# Patient Record
Sex: Female | Born: 1935 | Race: White | Hispanic: No | Smoking: Never smoker
Health system: Southern US, Community
[De-identification: ages and names within clinical notes are randomized; demographics above are authoritative.]

## PROBLEM LIST (undated history)

## (undated) DIAGNOSIS — F0390 Unspecified dementia without behavioral disturbance: Secondary | ICD-10-CM

## (undated) HISTORY — PX: NO PAST SURGERIES: SHX2092

---

## 2006-02-26 ENCOUNTER — Encounter: Admission: RE | Admit: 2006-02-26 | Discharge: 2006-02-26 | Payer: Self-pay | Admitting: Internal Medicine

## 2008-09-06 ENCOUNTER — Observation Stay (HOSPITAL_COMMUNITY): Admission: EM | Admit: 2008-09-06 | Discharge: 2008-09-06 | Payer: Self-pay | Admitting: Emergency Medicine

## 2008-11-05 ENCOUNTER — Inpatient Hospital Stay (HOSPITAL_COMMUNITY): Admission: EM | Admit: 2008-11-05 | Discharge: 2008-11-07 | Payer: Self-pay | Admitting: Emergency Medicine

## 2008-11-22 ENCOUNTER — Encounter: Admission: RE | Admit: 2008-11-22 | Discharge: 2008-11-22 | Payer: Self-pay | Admitting: Internal Medicine

## 2009-11-05 IMAGING — US US ABDOMEN COMPLETE
1 series · 14 of 25 positions shown · non-contrast
Comparison: None

CLINICAL DATA: Right upper quadrant pain, fever.

COMPLETE ABDOMINAL ULTRASOUND

[Series 1: us abdomen complete · 0.21mm/px · 14 of 65 slices shown]
[im 1/65]
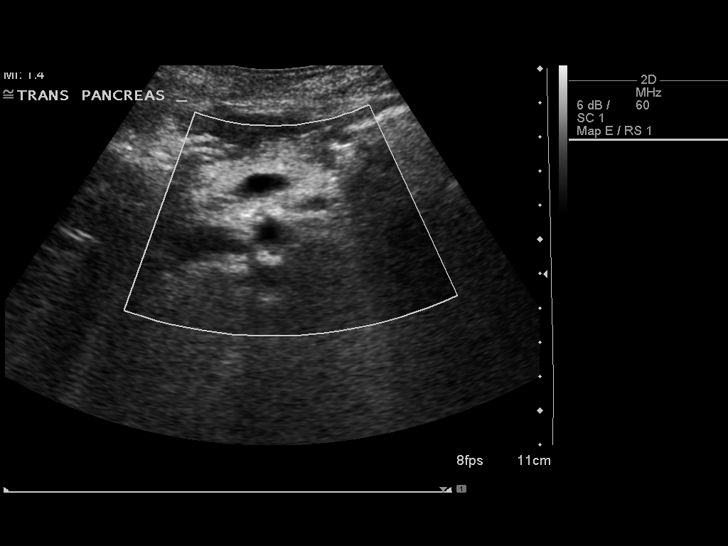
[im 6/65]
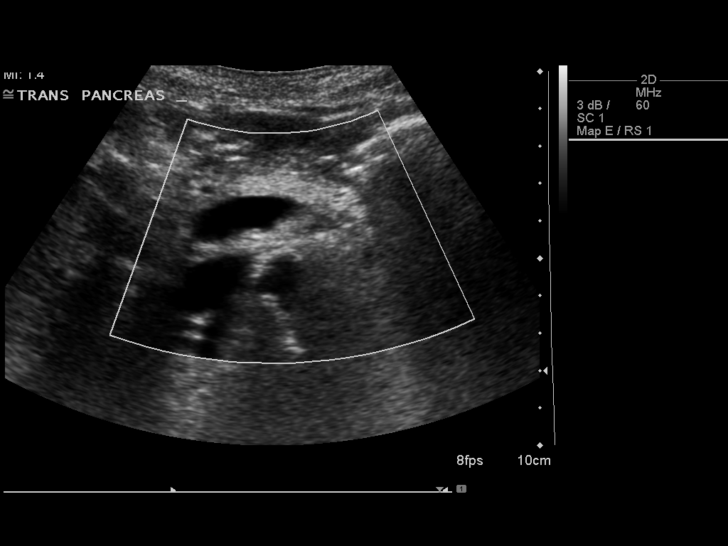
[im 11/65]
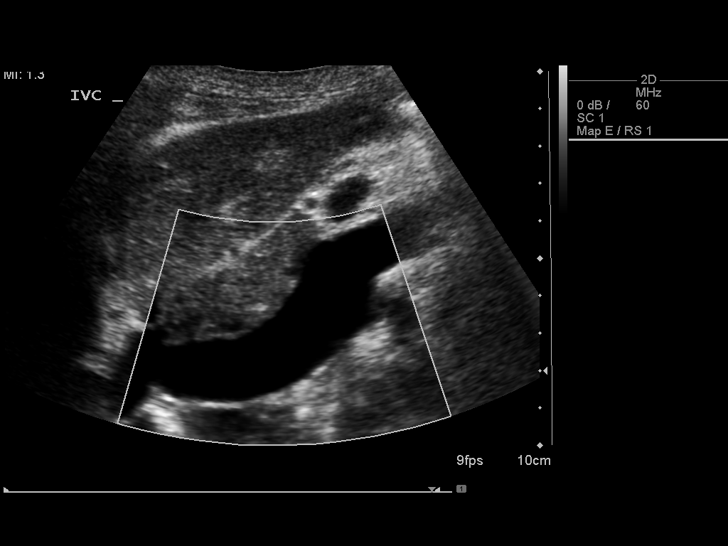
[im 17/65]
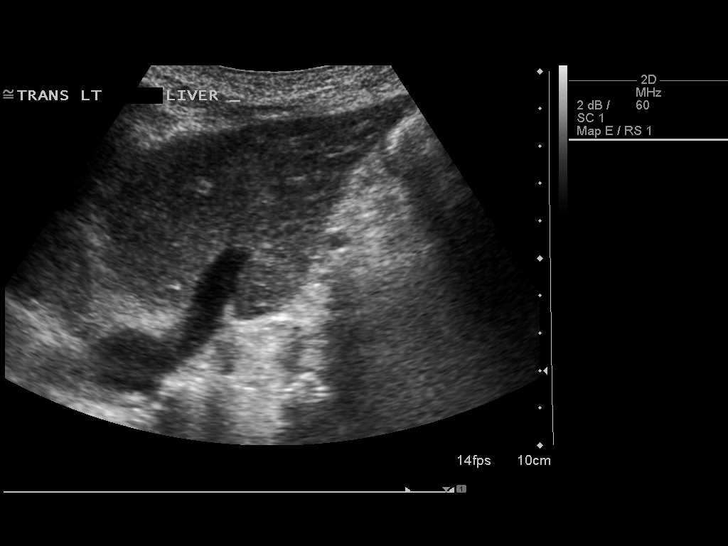
[im 22/65]
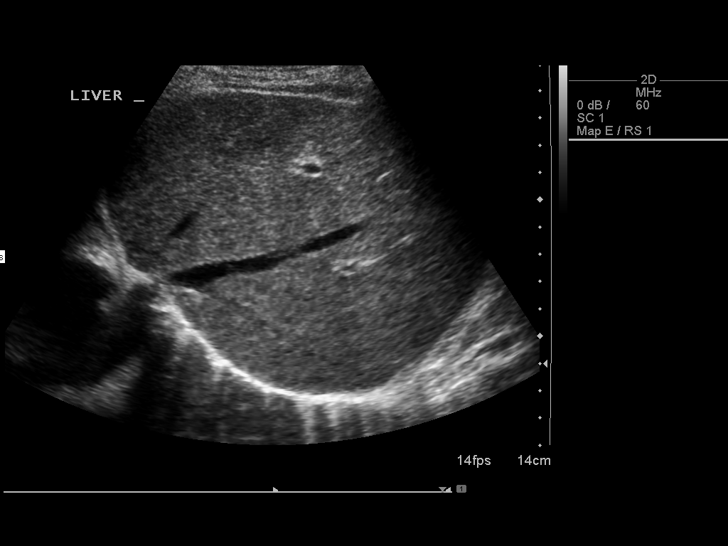
[im 25/65]
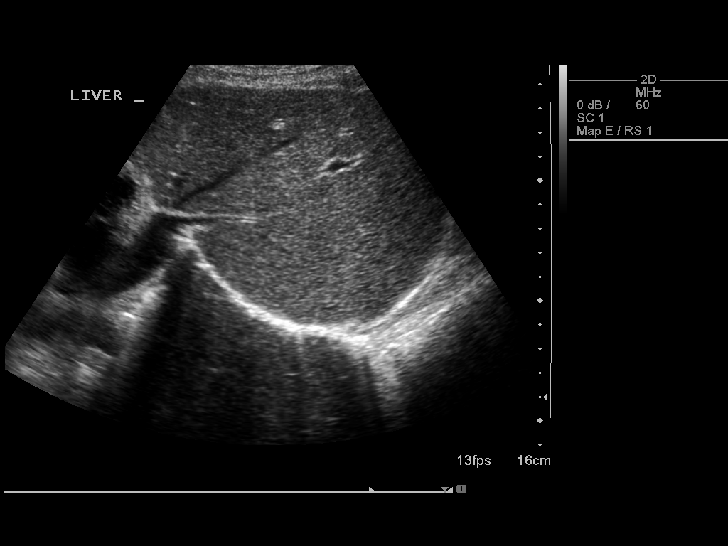
[im 30/65]
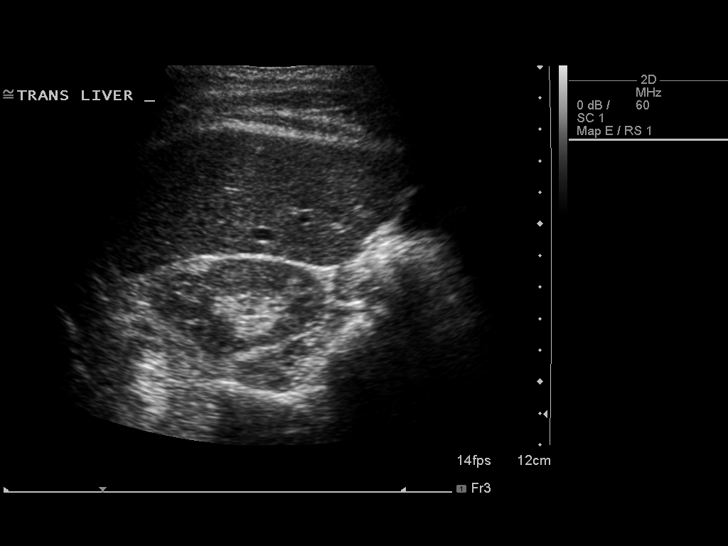
[im 35/65]
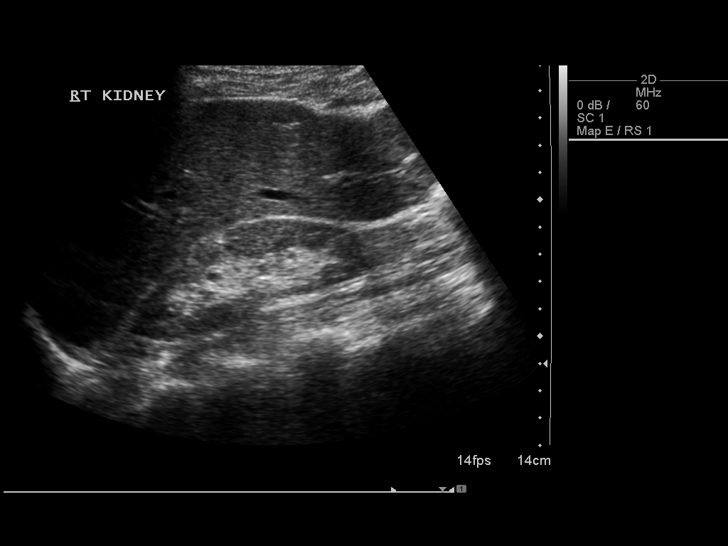
[im 41/65]
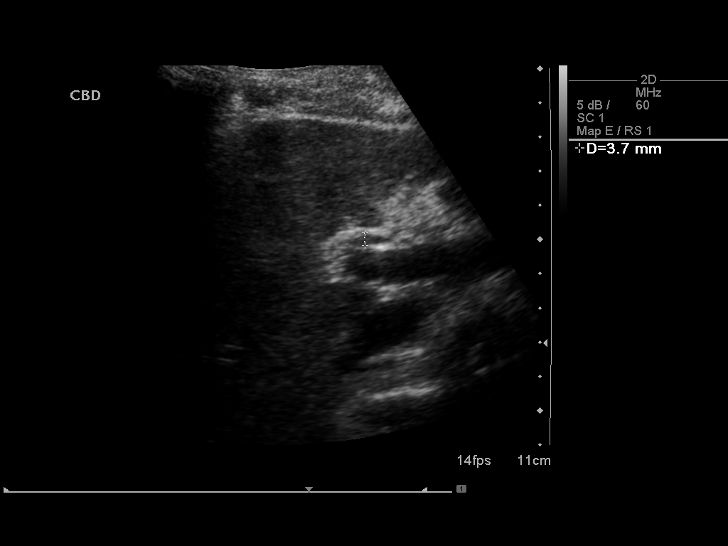
[im 43/65]
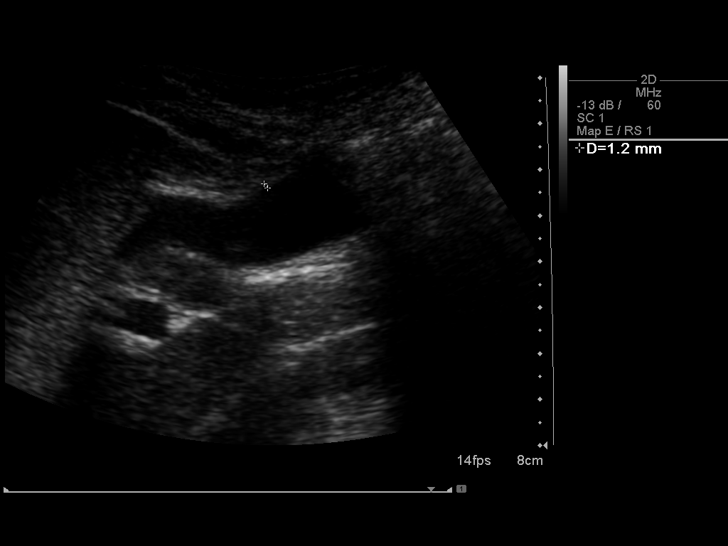
[im 49/65]
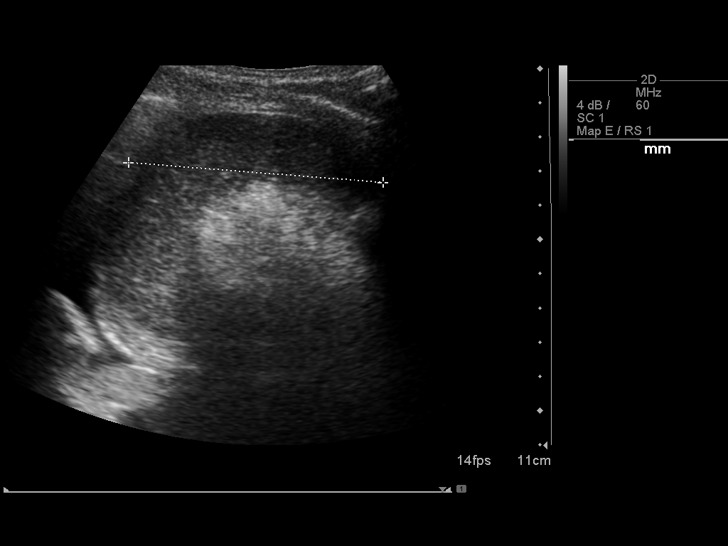
[im 54/65]
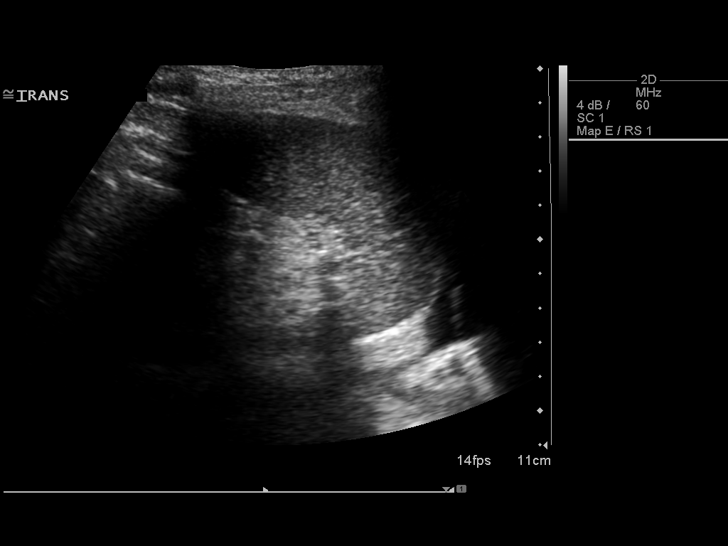
[im 59/65]
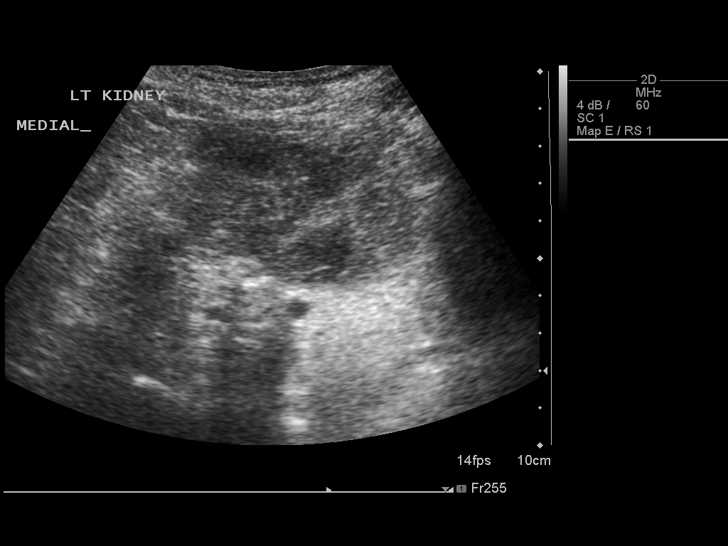
[im 65/65]
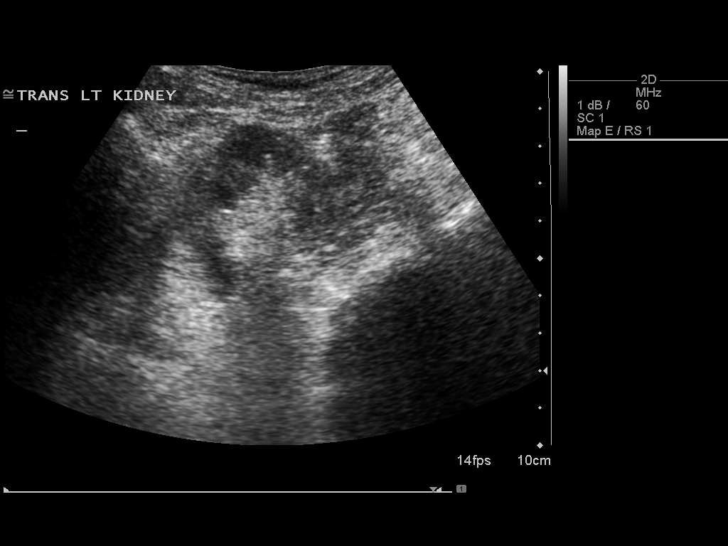

[14 of 25 positions shown; findings below may reference images not displayed]

FINDINGS: Gallbladder:  No stones or wall thickening.

Common bile duct:  Normal caliber, 4 mm.

Liver:  Normal size and echotexture.  No focal abnormality.

IVC:  Patent.

Pancreas:  Although the pancreas is difficult to visualize in its
entirety, no focal pancreatic abnormality is identified.

Spleen:  Normal size and echotexture.  No focal abnormality.

Right Kidney:  Normal size and echotexture.  No focal abnormality.
No hydronephrosis.

Left Kidney:  Normal size and echotexture.  No focal abnormality.
No hydronephrosis.

Abdominal aorta:  Atherosclerotic change.  No evidence of aneurysm.

Incidentally noted is a left pleural effusion.
IMPRESSION: No acute findings in the abdomen.

Small left pleural effusion.

## 2010-05-19 ENCOUNTER — Encounter: Payer: Self-pay | Admitting: Internal Medicine

## 2010-08-04 LAB — CK TOTAL AND CKMB (NOT AT ARMC): Relative Index: INVALID (ref 0.0–2.5)

## 2010-08-04 LAB — DIFFERENTIAL
Basophils Absolute: 0 10*3/uL (ref 0.0–0.1)
Basophils Relative: 0 % (ref 0–1)
Eosinophils Absolute: 0.1 10*3/uL (ref 0.0–0.7)
Eosinophils Relative: 1 % (ref 0–5)
Lymphocytes Relative: 9 % — ABNORMAL LOW (ref 12–46)
Lymphs Abs: 1.1 10*3/uL (ref 0.7–4.0)
Neutro Abs: 9.7 10*3/uL — ABNORMAL HIGH (ref 1.7–7.7)

## 2010-08-04 LAB — BASIC METABOLIC PANEL
CO2: 25 mEq/L (ref 19–32)
CO2: 27 mEq/L (ref 19–32)
Calcium: 8.5 mg/dL (ref 8.4–10.5)
Chloride: 107 mEq/L (ref 96–112)
Chloride: 107 mEq/L (ref 96–112)
GFR calc Af Amer: >60 mL/min (ref >60)
Glucose, Bld: 115 mg/dL — ABNORMAL HIGH (ref 70–99)
Potassium: 3.6 mEq/L (ref 3.5–5.1)
Potassium: 4 mEq/L (ref 3.5–5.1)

## 2010-08-04 LAB — CARDIAC PANEL(CRET KIN+CKTOT+MB+TROPI)
CK, MB: 1.2 ng/mL (ref 0.3–4.0)
Relative Index: INVALID (ref 0.0–2.5)
Total CK: 24 U/L (ref 7–177)
Troponin I: 0.07 ng/mL — ABNORMAL HIGH (ref 0.00–0.06)

## 2010-08-04 LAB — CBC
Hemoglobin: 11.6 g/dL — ABNORMAL LOW (ref 12.0–15.0)
MCHC: 34.6 g/dL (ref 30.0–36.0)
RBC: 3.75 MIL/uL — ABNORMAL LOW (ref 3.87–5.11)
RDW: 13.6 % (ref 11.5–15.5)
WBC: 12.1 10*3/uL — ABNORMAL HIGH (ref 4.0–10.5)

## 2010-08-04 LAB — POCT CARDIAC MARKERS

## 2010-08-04 LAB — SEDIMENTATION RATE: Sed Rate: 11 mm/hr (ref 0–22)

## 2010-08-06 LAB — DIFFERENTIAL
Basophils Absolute: 0 10*3/uL (ref 0.0–0.1)
Eosinophils Relative: 0 % (ref 0–5)
Lymphocytes Relative: 6 % — ABNORMAL LOW (ref 12–46)
Neutro Abs: 7.4 10*3/uL (ref 1.7–7.7)
Neutrophils Relative %: 88 % — ABNORMAL HIGH (ref 43–77)

## 2010-08-06 LAB — CBC
MCHC: 34.2 g/dL (ref 30.0–36.0)
RBC: 3.7 MIL/uL — ABNORMAL LOW (ref 3.87–5.11)
RDW: 14.2 % (ref 11.5–15.5)

## 2010-08-06 LAB — URINALYSIS, ROUTINE W REFLEX MICROSCOPIC
Glucose, UA: NEGATIVE mg/dL
Hgb urine dipstick: NEGATIVE
Nitrite: NEGATIVE
Specific Gravity, Urine: 1.022 (ref 1.005–1.030)
Urobilinogen, UA: 1 mg/dL (ref 0.0–1.0)

## 2010-08-06 LAB — COMPREHENSIVE METABOLIC PANEL
Alkaline Phosphatase: 76 U/L (ref 39–117)
BUN: 24 mg/dL — ABNORMAL HIGH (ref 6–23)
CO2: 26 mEq/L (ref 19–32)
Calcium: 8.4 mg/dL (ref 8.4–10.5)
GFR calc Af Amer: >60 mL/min (ref >60)
Glucose, Bld: 133 mg/dL — ABNORMAL HIGH (ref 70–99)
Potassium: 4.4 mEq/L (ref 3.5–5.1)
Sodium: 135 mEq/L (ref 135–145)
Total Bilirubin: 1.2 mg/dL (ref 0.3–1.2)
Total Protein: 6.2 g/dL (ref 6.0–8.3)

## 2010-08-06 LAB — URINE CULTURE

## 2010-08-06 LAB — URINE MICROSCOPIC-ADD ON

## 2010-09-10 NOTE — H&P (Signed)
Jessica Oneal, Jessica Oneal                 ACCOUNT NO.:  [id number]   MEDICAL RECORD NO.:  [id number]          PATIENT TYPE:  INP   LOCATION:  2022                         FACILITY:  [facility]   PHYSICIAN:  Hollice Espy, M.D.DATE OF BIRTH:  [birth year]   DATE OF ADMISSION:  11/05/2008  DATE OF DISCHARGE:                              HISTORY & PHYSICAL   PRIMARY CARE PHYSICIAN:  Georgann Housekeeper, MD   CHIEF COMPLAINT:  Chest pain.   HISTORY OF PRESENT ILLNESS:  Jessica Oneal is a very pleasant 77 year old  Caucasian female with no significant past medical history.  She is  brought to the emergency room by her daughter after she complained of a  substernal chest pain last night.  She describes the pain as a tightness  and never experienced anything like this before.  There was no  radiation.  Duration is uncertain.  The chest pain was associated with  shortness of breath, lightheaded.  She denies having any cough or  fevers.  There is __________.  There are no specific aggravating or  alleviating factors, but the pain was worsened with deep breath.  There  was no change with position, and the pain is not reproducible with  palpation.  There is no recent long-distance travel or full leg  symptoms.  There are no sick contacts.   REVIEW OF SYSTEMS:  A complete review of systems was done which in  General, Head, Eyes, Ears, Nose And Throat, Cardiovascular, Respiratory,  GI/GU, Endocrine, Musculoskeletal, Neurological, Psychiatric all within  normal limits other than what was mentioned in above.   PAST MEDICAL HISTORY:  Transient ischemic attack.   ALLERGIES:  None.   HOME MEDICATIONS:  Aspirin 81 mg one tablet p.o. daily.   FAMILY HISTORY:  Noncontributory.   SOCIAL HISTORY:  She is widowed, lives in [place].  There is a remote  history of tobacco abuse.  She quit smoking more than 40-50 years ago.  No alcohol or drugs.   PHYSICAL EXAMINATION:  VITAL SIGNS:  T-max 98.4, pulse oximetry  94-100%  on room air, blood pressure 162/61 on presentation, now 145/56, pulse  82.  GENERAL:  Not in any acute distress.  Alert and oriented x3.  Afebrile.  HEENT:  Normocephalic, atraumatic.  Pupils are equal to light and  accommodation.  Extraocular movements intact.  Oral mucous membranes are  moist.  NECK:  Supple.  No JVD, lymphadenopathy or carotid bruits.  LUNGS:  Mild crackles, left base.  CARDIOVASCULAR:  S1, S2 normal.  No murmurs appreciated.  ABDOMEN:  Benign.  EXTREMITIES:  No cyanosis, clubbing or edema.  NEUROLOGICAL:  Grossly nonfocal.   LABORATORY DATA:  CBC with differential:  WBC 12,100, hemoglobin 9.6,  hematocrit 33, platelets 190,000, neutrophils 80%, lymphocytes 9%,  monocytes 10%.  Sodium 140, potassium 4, chloride 107, bicarbonate 25,  BUN 36, glucose 153.  CK MB less than 1, troponin less than 0.05.  EKG:  None available to compare.  Normal sinus rhythm at the rate of 84 beats  per minute.  Axis is normal.  PR and QT intervals are normal.  Nonspecific ST changes.  No evidence of infarction.   ASSESSMENT/PLAN:  1. Chest pain, atypical.  Possible to include chronic ischemia versus      pneumonia versus pericarditis versus gastroesophageal reflux      disease.  The CT was negative for pulmonary embolism.  There is no      evidence of pericardial effusion on the CT scan.  There is mild      leukocytosis with a left shift.  Will continue the Avelox.  Will      also cycle cardiac enzymes to make sure she is not having an acute      myocardial infarction.  Will place her on telemetry.  2. Deep vein thrombosis prophylaxis with subcutaneous heparin.  3. Fluids, electrolytes and nutrition.  Will place her on AHA diet.      Will replace electrolytes as needed.  Will start her on normal      saline 75 cc an hour.   DISPOSITION:  Will admit the patient to cardiac floor with telemetry.      Hollice Espy, M.D.  Electronically Signed     SKK/MEDQ  D:   11/05/2008  T:  11/05/2008  Job:  [id number]

## 2010-09-10 NOTE — Discharge Summary (Signed)
Jessica, Oneal                 ACCOUNT NO.:  [id number]   MEDICAL RECORD NO.:  [id number]          PATIENT TYPE:  INP   LOCATION:  2022                         FACILITY:  [facility]   PHYSICIAN:  Thora Lance, M.D.  DATE OF BIRTH:  Feb 20, 1916   DATE OF ADMISSION:  11/05/2008  DATE OF DISCHARGE:  11/07/2008                               DISCHARGE SUMMARY   REASON FOR ADMISSION:  Jessica Oneal is a 77 year old white female whose  only medical history is a history of TIA and mild dementia.  She was  brought to the emergency room by her daughter after she complained of  substernal chest pain the night prior to admission.  The patient  described the pain as a tightness without radiation.  She felt short of  breath and lightheaded.   SIGNIFICANT FINDINGS:  VITAL SIGNS:  Temperature 98.4, blood pressure  162/61, heart rate 82.  NECK:  Supple.  No JVD.  LUNGS:  Mild crackles, left base.  HEART:  Regular rate and rhythm without murmur, gallop, or rub.  ABDOMEN:  Benign.  EXTREMITIES:  No edema.   CBC, WBC 12.1, hemoglobin 9.6, platelet 190.  Sodium 140, potassium 4,  chloride 107, bicarbonate 25, BUN 36, glucose 153, CK-MB less than 1,  troponin less than 0.05.  EKG, normal sinus rhythm with a rate of 84,  normal axis, normal intervals, and nonspecific ST changes.   HOSPITAL COURSE:  The patient was admitted with an episode of chest  pain.  Her CK and CK-MB remained normal.  Her troponin-I followup was  0.07, which is borderline elevated.  The patient's EKG did not progress.  She was placed on aspirin and low-dose metoprolol.  A CT scan of the  chest was done in the emergency room and showed no evidence of pulmonary  emboli or thoracic aneurysm.  There were mild ground-glass opacities  likely  representing small airway disease.  There was cardiomegaly and  some coronary artery calcification consistent with coronary artery  disease.  The patient's case was discussed with her daughter and  caregiver as well as her son.  The patient has evidence of mild dementia  and lives at home with not 24-hour care, but nighttime care and daytime  assistance.  Given the patient's advanced age and mild dementia, the  family did not feel they desired a full cardiac workup and would not  want invasive therapy.  We decided to treat the patient medically for  probable coronary artery disease/angina.  On the last hospital night,  the patient did have a brief episode of atrial fibrillation with rapid  ventricular response and quickly converted back to normal sinus rhythm.  She will be left on a beta-blocker and aspirin.  The family did indicate  that if the patient had further problems of chest pain and become  unstable, they might reconsider a cardiology consultation.   DISCHARGE DIAGNOSES:  1. Chest pain, probable angina pectoris.  2. Paroxysmal atrial fibrillation.  3. History of transient ischemic attack in May 2010.   PROCEDURES:  CT angiogram of the chest.  DISCHARGE MEDICATIONS:  1. Aspirin 325 mg 1 p.o. daily.  2. Metoprolol XL 25 mg 1 p.o. daily, titrate up as an outpatient as      needed.  3. Isosorbide mononitrate 30 mg 1 p.o. q.a.m.  4. Nitroglycerin 0.4 mg sublingual p.r.n. for chest pain.   DISPOSITION:  Discharged to home.   CODE STATUS:  Full code.   FOLLOWUP:  Two weeks with Dr. Georgann Housekeeper.   DIET:  Heart-healthy diet.           ______________________________  Thora Lance, M.D.     JJG/MEDQ  D:  11/07/2008  T:  11/07/2008  Job:  [id number]

## 2013-04-28 ENCOUNTER — Emergency Department (HOSPITAL_COMMUNITY): Payer: Medicare Other

## 2013-04-28 ENCOUNTER — Encounter (HOSPITAL_COMMUNITY): Payer: Self-pay | Admitting: Emergency Medicine

## 2013-04-28 ENCOUNTER — Inpatient Hospital Stay (HOSPITAL_COMMUNITY): Payer: Medicare Other

## 2013-04-28 ENCOUNTER — Inpatient Hospital Stay (HOSPITAL_COMMUNITY)
Admission: EM | Admit: 2013-04-28 | Discharge: 2013-05-02 | DRG: 871 | Disposition: A | Discharge status: Hospice | Payer: Medicare Other | Attending: Internal Medicine | Admitting: Internal Medicine

## 2013-04-28 DIAGNOSIS — J9600 Acute respiratory failure, unspecified whether with hypoxia or hypercapnia: Secondary | ICD-10-CM | POA: Diagnosis present

## 2013-04-28 DIAGNOSIS — Z66 Do not resuscitate: Secondary | ICD-10-CM | POA: Diagnosis present

## 2013-04-28 DIAGNOSIS — E860 Dehydration: Secondary | ICD-10-CM | POA: Diagnosis present

## 2013-04-28 DIAGNOSIS — A088 Other specified intestinal infections: Secondary | ICD-10-CM | POA: Diagnosis present

## 2013-04-28 DIAGNOSIS — E872 Acidosis, unspecified: Secondary | ICD-10-CM | POA: Diagnosis present

## 2013-04-28 DIAGNOSIS — Z515 Encounter for palliative care: Secondary | ICD-10-CM

## 2013-04-28 DIAGNOSIS — R6520 Severe sepsis without septic shock: Secondary | ICD-10-CM

## 2013-04-28 DIAGNOSIS — N179 Acute kidney failure, unspecified: Secondary | ICD-10-CM | POA: Diagnosis present

## 2013-04-28 DIAGNOSIS — J1100 Influenza due to unidentified influenza virus with unspecified type of pneumonia: Secondary | ICD-10-CM | POA: Diagnosis present

## 2013-04-28 DIAGNOSIS — A419 Sepsis, unspecified organism: Principal | ICD-10-CM | POA: Diagnosis present

## 2013-04-28 DIAGNOSIS — Z79899 Other long term (current) drug therapy: Secondary | ICD-10-CM

## 2013-04-28 DIAGNOSIS — A0811 Acute gastroenteropathy due to Norwalk agent: Secondary | ICD-10-CM | POA: Diagnosis present

## 2013-04-28 DIAGNOSIS — J111 Influenza due to unidentified influenza virus with other respiratory manifestations: Secondary | ICD-10-CM | POA: Diagnosis present

## 2013-04-28 DIAGNOSIS — F0390 Unspecified dementia without behavioral disturbance: Secondary | ICD-10-CM | POA: Diagnosis present

## 2013-04-28 DIAGNOSIS — J101 Influenza due to other identified influenza virus with other respiratory manifestations: Secondary | ICD-10-CM | POA: Diagnosis present

## 2013-04-28 DIAGNOSIS — G9340 Encephalopathy, unspecified: Secondary | ICD-10-CM

## 2013-04-28 HISTORY — DX: Unspecified dementia, unspecified severity, without behavioral disturbance, psychotic disturbance, mood disturbance, and anxiety: F03.90

## 2013-04-28 LAB — CG4 I-STAT (LACTIC ACID): Lactic Acid, Venous: 5.67 mmol/L — ABNORMAL HIGH (ref 0.5–2.2)

## 2013-04-28 LAB — POCT I-STAT 3, ART BLOOD GAS (G3+)
Acid-base deficit: 8 mmol/L — ABNORMAL HIGH (ref 0.0–2.0)
BICARBONATE: 21.6 meq/L (ref 20.0–24.0)
O2 Saturation: 89 %
PH ART: 7.135 — AB (ref 7.350–7.450)
PO2 ART: 74 mmHg — AB (ref 80.0–100.0)
Patient temperature: 98.6
TCO2: 23 mmol/L (ref 0–100)
pCO2 arterial: 64.1 mmHg (ref 35.0–45.0)

## 2013-04-28 LAB — CREATININE, SERUM
Creatinine, Ser: 1.53 mg/dL — ABNORMAL HIGH (ref 0.50–1.10)
GFR calc Af Amer: 32 mL/min — ABNORMAL LOW (ref >90)
GFR calc non Af Amer: 28 mL/min — ABNORMAL LOW (ref >90)

## 2013-04-28 LAB — CBC WITH DIFFERENTIAL/PLATELET
Basophils Absolute: 0 10*3/uL (ref 0.0–0.1)
Basophils Relative: 0 % (ref 0–1)
Eosinophils Absolute: 0 10*3/uL (ref 0.0–0.7)
Eosinophils Relative: 0 % (ref 0–5)
HCT: 35.1 % — ABNORMAL LOW (ref 36.0–46.0)
Hemoglobin: 11.6 g/dL — ABNORMAL LOW (ref 12.0–15.0)
Lymphocytes Relative: 18 % (ref 12–46)
Lymphs Abs: 2.1 10*3/uL (ref 0.7–4.0)
MCH: 30.7 pg (ref 26.0–34.0)
MCHC: 33 g/dL (ref 30.0–36.0)
MCV: 92.9 fL (ref 78.0–100.0)
MONO ABS: 0.7 10*3/uL (ref 0.1–1.0)
Monocytes Relative: 6 % (ref 3–12)
NEUTROS PCT: 76 % (ref 43–77)
Neutro Abs: 9 10*3/uL — ABNORMAL HIGH (ref 1.7–7.7)
Platelets: 199 10*3/uL (ref 150–400)
RBC: 3.78 MIL/uL — ABNORMAL LOW (ref 3.87–5.11)
RDW: 14.3 % (ref 11.5–15.5)
WBC: 11.8 10*3/uL — AB (ref 4.0–10.5)

## 2013-04-28 LAB — POCT I-STAT, CHEM 8
BUN: 42 mg/dL — ABNORMAL HIGH (ref 6–23)
CHLORIDE: 101 meq/L (ref 96–112)
Calcium, Ion: 1.1 mmol/L — ABNORMAL LOW (ref 1.13–1.30)
Creatinine, Ser: 1.7 mg/dL — ABNORMAL HIGH (ref 0.50–1.10)
Glucose, Bld: 185 mg/dL — ABNORMAL HIGH (ref 70–99)
HCT: 35 % — ABNORMAL LOW (ref 36.0–46.0)
Hemoglobin: 11.9 g/dL — ABNORMAL LOW (ref 12.0–15.0)
Potassium: 4.1 mEq/L (ref 3.7–5.3)
SODIUM: 138 meq/L (ref 137–147)
TCO2: 24 mmol/L (ref 0–100)

## 2013-04-28 LAB — URINALYSIS, ROUTINE W REFLEX MICROSCOPIC
BILIRUBIN URINE: NEGATIVE
BILIRUBIN URINE: NEGATIVE
GLUCOSE, UA: NEGATIVE mg/dL
Glucose, UA: NEGATIVE mg/dL
Ketones, ur: NEGATIVE mg/dL
Ketones, ur: NEGATIVE mg/dL
Leukocytes, UA: NEGATIVE
Leukocytes, UA: NEGATIVE
Nitrite: NEGATIVE
Nitrite: NEGATIVE
PH: 5 (ref 5.0–8.0)
PH: 5 (ref 5.0–8.0)
Protein, ur: 30 mg/dL — AB
Protein, ur: 30 mg/dL — AB
SPECIFIC GRAVITY, URINE: 1.021 (ref 1.005–1.030)
SPECIFIC GRAVITY, URINE: 1.024 (ref 1.005–1.030)
UROBILINOGEN UA: 1 mg/dL (ref 0.0–1.0)
Urobilinogen, UA: 1 mg/dL (ref 0.0–1.0)

## 2013-04-28 LAB — CBC
HEMATOCRIT: 31.8 % — AB (ref 36.0–46.0)
HEMOGLOBIN: 10.7 g/dL — AB (ref 12.0–15.0)
MCH: 31 pg (ref 26.0–34.0)
MCHC: 33.6 g/dL (ref 30.0–36.0)
MCV: 92.2 fL (ref 78.0–100.0)
Platelets: 136 10*3/uL — ABNORMAL LOW (ref 150–400)
RBC: 3.45 MIL/uL — ABNORMAL LOW (ref 3.87–5.11)
RDW: 14.4 % (ref 11.5–15.5)
WBC: 7.2 10*3/uL (ref 4.0–10.5)

## 2013-04-28 LAB — INFLUENZA PANEL BY PCR (TYPE A & B)
H1N1FLUPCR: NOT DETECTED
INFLBPCR: NEGATIVE
Influenza A By PCR: POSITIVE — AB

## 2013-04-28 LAB — POCT I-STAT TROPONIN I: Troponin i, poc: 0.08 ng/mL (ref 0.00–0.08)

## 2013-04-28 LAB — URINE MICROSCOPIC-ADD ON

## 2013-04-28 LAB — HEPATIC FUNCTION PANEL
ALBUMIN: 2.9 g/dL — AB (ref 3.5–5.2)
ALK PHOS: 86 U/L (ref 39–117)
ALT: 13 U/L (ref 0–35)
AST: 31 U/L (ref 0–37)
BILIRUBIN TOTAL: 0.3 mg/dL (ref 0.3–1.2)
Bilirubin, Direct: <0.2 mg/dL (ref 0.0–0.3)
Total Protein: 6.4 g/dL (ref 6.0–8.3)

## 2013-04-28 LAB — MAGNESIUM: Magnesium: 2 mg/dL (ref 1.5–2.5)

## 2013-04-28 LAB — PRO B NATRIURETIC PEPTIDE: Pro B Natriuretic peptide (BNP): 9395 pg/mL — ABNORMAL HIGH (ref 0–450)

## 2013-04-28 MED ORDER — PIPERACILLIN-TAZOBACTAM 3.375 G IVPB 30 MIN
3.3750 g | Freq: Once | INTRAVENOUS | Status: AC
  Administered 2013-04-28: 3.375 g via INTRAVENOUS
  Filled 2013-04-28: qty 50

## 2013-04-28 MED ORDER — PIPERACILLIN-TAZOBACTAM 3.375 G IVPB 30 MIN: 3.3750 g | Freq: Once | INTRAVENOUS | Status: DC

## 2013-04-28 MED ORDER — ENOXAPARIN SODIUM 40 MG/0.4ML ~~LOC~~ SOLN
40.0000 mg | SUBCUTANEOUS | Status: DC
  Administered 2013-04-28: 40 mg via SUBCUTANEOUS
  Filled 2013-04-28: qty 0.4

## 2013-04-28 MED ORDER — SODIUM CHLORIDE 0.9 % IJ SOLN
3.0000 mL | Freq: Two times a day (BID) | INTRAMUSCULAR | Status: DC
  Administered 2013-04-28 – 2013-04-30 (×5): 3 mL via INTRAVENOUS

## 2013-04-28 MED ORDER — ALBUTEROL SULFATE (2.5 MG/3ML) 0.083% IN NEBU
INHALATION_SOLUTION | RESPIRATORY_TRACT | Status: AC
  Administered 2013-04-28: 2.5 mg
  Filled 2013-04-28: qty 6

## 2013-04-28 MED ORDER — VANCOMYCIN HCL 1000 MG IV SOLR: 15.0000 mg/kg | Freq: Once | INTRAVENOUS | Status: DC

## 2013-04-28 MED ORDER — SODIUM CHLORIDE 0.9 % IV SOLN: INTRAVENOUS | Status: DC

## 2013-04-28 MED ORDER — VANCOMYCIN HCL 500 MG IV SOLR: 500.0000 mg | INTRAVENOUS | Status: DC

## 2013-04-28 MED ORDER — SODIUM CHLORIDE 0.9 % IV BOLUS (SEPSIS)
500.0000 mL | Freq: Once | INTRAVENOUS | Status: AC
  Administered 2013-04-28: 500 mL via INTRAVENOUS

## 2013-04-28 MED ORDER — PIPERACILLIN-TAZOBACTAM IN DEX 2-0.25 GM/50ML IV SOLN
2.2500 g | Freq: Three times a day (TID) | INTRAVENOUS | Status: DC
  Administered 2013-04-29 (×2): 2.25 g via INTRAVENOUS
  Filled 2013-04-28 (×4): qty 50

## 2013-04-28 MED ORDER — VANCOMYCIN HCL IN DEXTROSE 750-5 MG/150ML-% IV SOLN
750.0000 mg | Freq: Once | INTRAVENOUS | Status: AC
  Administered 2013-04-28: 750 mg via INTRAVENOUS
  Filled 2013-04-28: qty 150

## 2013-04-28 NOTE — ED Notes (Signed)
Pt sick yesterday and started having altered mental status. Today pt responding only to painful stimuli. Pt has audible rales and required CPAP in route to ED. Pt's sats 94% on CPAP, BP 120/50, sinus tach. Pt also developed tremors today. Pt has no significant medical history.

## 2013-04-28 NOTE — ED Provider Notes (Signed)
CSN: [id number]     Arrival date & time 04/28/13  1558 History   First MD Initiated Contact with Patient 04/28/13 1608     Chief Complaint  Patient presents with  . Shortness of Breath   HPI  80 y/o female with no significant past medical history (currently not on any mediations per the family) who presents via EMS for shortness of breath. The patient states the patient has been sick for the past two days. They states she has had cough. At baseline they states she is usually not very interactive and will only sometimes speak. Today she has been more lethargic than normal. EMS was called and she was started on CPAP. Additional history and ROS are limited secondary to the condition of the patient.   Past Medical History  Diagnosis Date  . Dementia    Past Surgical History  Procedure Laterality Date  . No past surgeries     No family history on file. History  Substance Use Topics  . Smoking status: Never Smoker   . Smokeless tobacco: Never Used  . Alcohol Use: No   OB History   Grav Para Term Preterm Abortions TAB SAB Ect Mult Living                 Review of Systems  Unable to perform ROS: Mental status change   Allergies  Review of patient's allergies indicates no known allergies.  Home Medications  No current outpatient prescriptions on file. BP 155/43  Pulse 94  Temp(Src) 98.2 F (36.8 C) (Oral)  Resp 36  Ht 5\' 1"  (1.549 m)  Wt 79 lb 9.4 oz (36.1 kg)  BMI 15.05 kg/m2  SpO2 93% Physical Exam  Nursing note and vitals reviewed. Constitutional: She appears ill.  Thin  HENT:  Head: Normocephalic and atraumatic.  Eyes: Conjunctivae are normal. Pupils are equal, round, and reactive to light.  Neck: Normal range of motion. Neck supple.  Cardiovascular: Normal rate.  Exam reveals no gallop and no friction rub.   No murmur heard. Pulmonary/Chest: Tachypnea noted. She is in respiratory distress (mild). She has decreased breath sounds (bilateral bases). She has rhonchi  (diffuse).  On CPAP  Abdominal: Soft. She exhibits no distension. There is no tenderness.  Musculoskeletal: Normal range of motion. She exhibits no edema and no tenderness.  Neurological: GCS eye subscore is 1. GCS verbal subscore is 1. GCS motor subscore is 1.  Skin: Skin is warm and dry.    ED Course  Procedures (including critical care time) Labs Review Labs Reviewed  PRO B NATRIURETIC PEPTIDE - Abnormal; Notable for the following:    Pro B Natriuretic peptide (BNP) 9395.0 (*)    All other components within normal limits  CBC WITH DIFFERENTIAL - Abnormal; Notable for the following:    WBC 11.8 (*)    RBC 3.78 (*)    Hemoglobin 11.6 (*)    HCT 35.1 (*)    Neutro Abs 9.0 (*)    All other components within normal limits  URINALYSIS, ROUTINE W REFLEX MICROSCOPIC - Abnormal; Notable for the following:    Color, Urine AMBER (*)    APPearance CLOUDY (*)    Hgb urine dipstick TRACE (*)    Protein, ur 30 (*)    All other components within normal limits  INFLUENZA PANEL BY PCR - Abnormal; Notable for the following:    Influenza A By PCR POSITIVE (*)    All other components within normal limits  URINE MICROSCOPIC-ADD ON -  Abnormal; Notable for the following:    Casts HYALINE CASTS (*)    All other components within normal limits  CBC - Abnormal; Notable for the following:    RBC 3.45 (*)    Hemoglobin 10.7 (*)    HCT 31.8 (*)    Platelets 136 (*)    All other components within normal limits  CREATININE, SERUM - Abnormal; Notable for the following:    Creatinine, Ser 1.53 (*)    GFR calc non Af Amer 28 (*)    GFR calc Af Amer 32 (*)    All other components within normal limits  HEPATIC FUNCTION PANEL - Abnormal; Notable for the following:    Albumin 2.9 (*)    All other components within normal limits  URINALYSIS, ROUTINE W REFLEX MICROSCOPIC - Abnormal; Notable for the following:    APPearance TURBID (*)    Hgb urine dipstick MODERATE (*)    Protein, ur 30 (*)    All other  components within normal limits  COMPREHENSIVE METABOLIC PANEL - Abnormal; Notable for the following:    Glucose, Bld 110 (*)    BUN 40 (*)    Creatinine, Ser 1.34 (*)    Calcium 7.8 (*)    Albumin 2.7 (*)    GFR calc non Af Amer 33 (*)    GFR calc Af Amer 38 (*)    All other components within normal limits  CBC - Abnormal; Notable for the following:    WBC 11.0 (*)    RBC 3.44 (*)    Hemoglobin 10.5 (*)    HCT 31.2 (*)    All other components within normal limits  URINE MICROSCOPIC-ADD ON - Abnormal; Notable for the following:    Bacteria, UA FEW (*)    All other components within normal limits  POCT I-STAT 3, BLOOD GAS (G3+) - Abnormal; Notable for the following:    pH, Arterial 7.135 (*)    pCO2 arterial 64.1 (*)    pO2, Arterial 74.0 (*)    Acid-base deficit 8.0 (*)    All other components within normal limits  POCT I-STAT, CHEM 8 - Abnormal; Notable for the following:    BUN 42 (*)    Creatinine, Ser 1.70 (*)    Glucose, Bld 185 (*)    Calcium, Ion 1.10 (*)    Hemoglobin 11.9 (*)    HCT 35.0 (*)    All other components within normal limits  CG4 I-STAT (LACTIC ACID) - Abnormal; Notable for the following:    Lactic Acid, Venous 5.67 (*)    All other components within normal limits  MRSA PCR SCREENING  URINE CULTURE  CULTURE, BLOOD (ROUTINE X 2)  CULTURE, BLOOD (ROUTINE X 2)  CULTURE, BLOOD (ROUTINE X 2)  CULTURE, BLOOD (ROUTINE X 2)  URINE CULTURE  MAGNESIUM  TSH  POCT I-STAT TROPONIN I   Imaging Review Dg Chest 1 View  04/28/2013   CLINICAL DATA:  Shortness of breath.  EXAM: CHEST - 1 VIEW  COMPARISON:  02/19/2009.  FINDINGS: The cardiac silhouette, mediastinal and hilar contours are stable. There is tortuosity and calcification of the thoracic aorta. Chronic emphysematous changes with probable superimposed bronchitis. No focal airspace consolidation or pleural effusion. The bony thorax is intact.  IMPRESSION: Emphysema with low lung volumes.  Suspect overlying  bronchitis.   Electronically Signed   By: Loralie Champagne M.D.   On: 04/28/2013 16:19   Portable Chest 1 View  04/29/2013   CLINICAL DATA:  Pneumonia .  EXAM: PORTABLE CHEST - 1 VIEW  COMPARISON:  04/28/2013.  02/19/2009.  FINDINGS: Mediastinum and hilar structures are normal. Changes suggesting COPD noted. Mild bilateral interstitial prominence noted. These findings suggest pneumonitis. Heart size and pulmonary vascularity normal. No pleural effusion pneumothorax. No acute osseous abnormality.  IMPRESSION: 1. Persistent interstitial prominence suggesting bilateral pneumonitis. 2. COPD.   Electronically Signed   By: Maisie Fus  Register   On: 04/29/2013 07:11   Dg Abd Portable 1v  04/28/2013   CLINICAL DATA:  Diarrhea.  EXAM: PORTABLE ABDOMEN - 1 VIEW  COMPARISON:  Abdominal radiograph 09/06/2008.  FINDINGS: Gas and stool are noted throughout the colon extending to the level of the distal rectum. Multiple nondilated gas-filled loops of small bowel project over the central abdomen. No definite pathologic dilatation of small bowel. No gross evidence of pneumoperitoneum on this single supine view. A rectal thermistor noted.  IMPRESSION: 1. Nonspecific, nonobstructive bowel gas pattern. 2. No pneumoperitoneum.   Electronically Signed   By: Trudie Reed M.D.   On: 04/28/2013 19:09    EKG Interpretation    Date/Time:  Thursday April 28 2013 16:06:11 EST Ventricular Rate:  108 PR Interval:  133 QRS Duration: 76 QT Interval:  394 QTC Calculation: 528 R Axis:   33 Text Interpretation:  Sinus tachycardia Abnormal R-wave progression, early transition Nonspecific T abnormalities, lateral leads Prolonged QT interval Confirmed by BEATON  MD, ROBERT ([id number]) on 04/28/2013 6:06:48 PM            MDM   1. Influenza A   2. Acute respiratory failure   3. Sepsis     Ill appearing. Febrile to 101.3. Tachypnic and hypoxic. Mixed respiratory and metabolic acidosis. CXR clear without focal infiltrate but  suspect pneumonia as etiology. Lactate elevated. ABG with hypercarbic as well as hypoxic respiratory failure. Maintained on BiPAP throughout her ED stay. Mental status poor but family states the patient would not want to be intubated or receive chest compressions. Broad spectrum ABX given after obtaining cultures. ARF noted on BMP. Flu swab pending. The patient was admitted to stepdown.     Shanon Ace, MD 04/29/13 1710

## 2013-04-28 NOTE — Progress Notes (Signed)
eLink Physician-Brief Progress Note Patient Name: Jessica Oneal DOB: Mar 27, 1916 MRN: [id number]  Date of Service  04/28/2013   HPI/Events of Note   Pt admitted by hospitalist  . Came up to unit on bipap but no orders for same  eICU Interventions  bipap ordered    Intervention Category Major Interventions: Respiratory failure - evaluation and management  Shan Levansatrick Wright 04/28/2013, 7:45 PM

## 2013-04-28 NOTE — H&P (Signed)
[facility] History and Physical  Flo Shanksthel Hodgkin JXB:[id number] DOB: [birth year] DOA: 04/28/2013  Referring physician:  PCP: Pcp Not In System   Chief Complaint: AMS   HPI:   80 year old female with history of dementia, previously a patient of Dr. Westley Chandlerkarr hussain , who presents with altered mental status, and is currently unresponsive at the time of this history taking. According to the family that takes care of her, her son her daughter. The patient has been sick for the last 2 days. She has been more sleepy, lethargic, decreased by mouth intake ,  apparently no fever at home, however she was found to be febrile in the ER. The patient apparently had a loose bowel movement in the ED  Patient was fairly hypotensive upon presentation systolic blood pressure in the 70s. ABG showed a pH of 7.135 CO2 of 64. Her lactic acid was elevated at 5.67. Urinalysis was negative Chest x-ray was negative  Family present at the bedside and a very realistic about expectations from this hospitalization. Understand the poor prognosis of the patient, and they do not want any heroic measures.       Review of Systems: negative for the following   Unable to obtain because of underlying dementia except for what is documented in history of present illness      History reviewed. No pertinent past medical history.   History reviewed. No pertinent past surgical history.    Social History:  has no tobacco, alcohol, and drug history on file. Lives at home with family   No Known Allergies  No family history on file.   Prior to Admission medications   Not on File     Physical Exam: Filed Vitals:   04/28/13 1600 04/28/13 1645 04/28/13 1645 04/28/13 1650  BP:  105/40    Pulse:  98    Temp:  101.3 F (38.5 C)    Resp: 33 30    Height:   5\' 1"  (1.549 m)   Weight:   36.288 kg (80 lb)   SpO2: 96% 91%  96%     Constitutional: Vital signs reviewed. Patient is a well-developed and well-nourished in  no acute distress and cooperative with exam. 100, somnolent Head: Normocephalic and atraumatic  Ear: TM normal bilaterally  Mouth: no erythema or exudates, MMM  Eyes: PERRL, EOMI, conjunctivae normal, No scleral icterus.  Neck: Supple, Trachea midline normal ROM, No JVD, mass, thyromegaly, or carotid bruit present.  Cardiovascular: RRR, S1 normal, S2 normal, no MRG, pulses symmetric and intact bilaterally  Pulmonary/Chest: CTAB, no wheezes, rales, or rhonchi  Abdominal: Soft. Non-tender, non-distended, bowel sounds are normal, no masses, organomegaly, or guarding present.  GU: no CVA tenderness Musculoskeletal: No joint deformities, erythema, or stiffness, ROM full and no nontender Ext: no edema and no cyanosis, pulses palpable bilaterally (DP and PT)  Hematology: no cervical, inginal, or axillary adenopathy.  Neurological:  obtunded, Strenght is normal and symmetric bilaterally, cranial nerve II-XII are grossly intact, no focal motor deficit, sensory intact to light touch bilaterally.  Skin: Warm, dry and intact. No rash, cyanosis, or clubbing.  Psychiatric: Normal mood and affect. speech and behavior is normal. Judgment and thought content normal. Cognition and memory are normal.       Labs on Admission:    Basic Metabolic Panel:  Recent Labs Lab 04/28/13 1726  NA 138  K 4.1  CL 101  GLUCOSE 185*  BUN 42*  CREATININE 1.70*   Liver Function Tests: No results found for this  basename: AST, ALT, ALKPHOS, BILITOT, PROT, ALBUMIN,  in the last 168 hours No results found for this basename: LIPASE, AMYLASE,  in the last 168 hours No results found for this basename: AMMONIA,  in the last 168 hours CBC:  Recent Labs Lab 04/28/13 1712 04/28/13 1726  WBC 11.8*  --   NEUTROABS PENDING  --   HGB 11.6* 11.9*  HCT 35.1* 35.0*  MCV 92.9  --   PLT 199  --    Cardiac Enzymes: No results found for this basename: CKTOTAL, CKMB, CKMBINDEX, TROPONINI,  in the last 168 hours  BNP  (last 3 results) No results found for this basename: PROBNP,  in the last 8760 hours    CBG: No results found for this basename: GLUCAP,  in the last 168 hours  Radiological Exams on Admission: Dg Chest 1 View  04/28/2013   CLINICAL DATA:  Shortness of breath.  EXAM: CHEST - 1 VIEW  COMPARISON:  02/19/2009.  FINDINGS: The cardiac silhouette, mediastinal and hilar contours are stable. There is tortuosity and calcification of the thoracic aorta. Chronic emphysematous changes with probable superimposed bronchitis. No focal airspace consolidation or pleural effusion. The bony thorax is intact.  IMPRESSION: Emphysema with low lung volumes.  Suspect overlying bronchitis.   Electronically Signed   By: Loralie Champagne M.D.   On: 04/28/2013 16:19    EKG: Independently reviewed.    Assessment/Plan Active Problems:   Sepsis   Sepsis  Unclear source. Will order repeat chest x-ray in the morning. Patient to have viral gastroenteritis may be norovirus versus community-acquired pneumonia versus aspiration pneumonia, with negative chest x-ray because of dehydration Broad-spectrum antibiotics have been initiated History the patient aggressively with IV fluids Patient has been placed on CPAP because of hypercapnia She has a mixture of respiratory as well as metabolic acidosis  Obtain blood culture, urine culture, influenza in the pending  Will order a C. difficile PCR to rule out C. difficile although the patient has not been on any recent antibiotics   Dementia Family realistic about no heroic measures If no improvement in 24-48 hours consider palliative care   Code Status:   DO NOT RESUSCITATE Family Communication: bedside Disposition Plan: admit   Time spent: 70 mins   [facility] Pager [phone]  If 7PM-7AM, please contact night-coverage [email or web address] Password [email or web address] 04/28/2013, 6:04 PM

## 2013-04-28 NOTE — ED Notes (Signed)
Blood cultures were drawn by phlebotomy before hanging antibiotics.

## 2013-04-28 NOTE — ED Notes (Signed)
Pt's family states this is pt's mental baseline. She does not speak and usually is not responsive.

## 2013-04-28 NOTE — Progress Notes (Signed)
ANTIBIOTIC CONSULT NOTE - INITIAL  Pharmacy Consult for vancomycin and Zosyn Indication: sepsis  No Known Allergies  Patient Measurements: Height: 5\' 1"  (154.9 cm) Weight: 80 lb (36.288 kg) IBW/kg (Calculated) : 47.8  Vital Signs: Temp: 101.3 F (38.5 C) (01/01 1645) BP: 105/40 mmHg (01/01 1645) Pulse Rate: 98 (01/01 1645)  Labs:  Recent Labs  04/28/13 1726  HGB 11.9*  CREATININE 1.70*   Estimated Creatinine Clearance: 11.3 ml/min (by C-G formula based on Cr of 1.7). No results found for this basename: VANCOTROUGH, VANCOPEAK, VANCORANDOM, GENTTROUGH, GENTPEAK, GENTRANDOM, TOBRATROUGH, TOBRAPEAK, TOBRARND, AMIKACINPEAK, AMIKACINTROU, AMIKACIN,  in the last 72 hours   Microbiology: No results found for this or any previous visit (from the past 720 hour(s)).  Medical History: History reviewed. No pertinent past medical history.  Medications:  Scheduled:  . enoxaparin (LOVENOX) injection  40 mg Subcutaneous Q24H  . sodium chloride  3 mL Intravenous Q12H  . vancomycin  750 mg Intravenous Once   Infusions:  . sodium chloride     Assessment: 80 yo F presented to emergency department with concerns for AMS and required CPAP in route. Patient with elevated lactic acid of 5.67.  Pharmacy consulted to dose vancomycin and Zosyn for sepsis.    Patient is febrile (101.3) with elevated WBC of 11.8.  She received one dose each of vancomycin IV 750mg  and Zosyn IV 3.375g in the ER.  SCr is 1.7 with an estimated CrCl~11.  Also of note is low body weight of 36.3 kg.    Goal of Therapy:  Resolution of infection Vancomycin trough level 15-20 mcg/ml  Plan:  - begin 48h after initial LD, maintenance dose of vancomycin IV 500mg  q48h - consider random vancomycin level with AM labs prior to redose, monitor kidney function - begin 8h after first dose, maintenance dose of Zosyn 2.25g q8h - f/u WBC, fever curve, cultures, and clinical progression  Harrold DonathNathan E. Achilles Dunkope, PharmD Clinical  Pharmacist - Resident Pager: [phone] Pharmacy: [phone] 04/28/2013 6:13 PM

## 2013-04-28 NOTE — ED Notes (Signed)
NOTIFIED DR. BEATON IN PERSON OF PATIENTS LAB RESULTS OF CG4 LACTIC ACID = 5.67 mmoI/L, @ 17:38 PM, 04/28/2013.

## 2013-04-29 ENCOUNTER — Encounter (HOSPITAL_COMMUNITY): Payer: Self-pay | Admitting: Emergency Medicine

## 2013-04-29 ENCOUNTER — Inpatient Hospital Stay (HOSPITAL_COMMUNITY): Payer: Medicare Other

## 2013-04-29 DIAGNOSIS — J101 Influenza due to other identified influenza virus with other respiratory manifestations: Secondary | ICD-10-CM | POA: Diagnosis present

## 2013-04-29 DIAGNOSIS — A419 Sepsis, unspecified organism: Secondary | ICD-10-CM

## 2013-04-29 DIAGNOSIS — J9600 Acute respiratory failure, unspecified whether with hypoxia or hypercapnia: Secondary | ICD-10-CM | POA: Diagnosis present

## 2013-04-29 DIAGNOSIS — J111 Influenza due to unidentified influenza virus with other respiratory manifestations: Secondary | ICD-10-CM

## 2013-04-29 LAB — CBC
HCT: 31.2 % — ABNORMAL LOW (ref 36.0–46.0)
Hemoglobin: 10.5 g/dL — ABNORMAL LOW (ref 12.0–15.0)
MCH: 30.5 pg (ref 26.0–34.0)
MCHC: 33.7 g/dL (ref 30.0–36.0)
MCV: 90.7 fL (ref 78.0–100.0)
Platelets: 162 10*3/uL (ref 150–400)
RBC: 3.44 MIL/uL — ABNORMAL LOW (ref 3.87–5.11)
RDW: 14.2 % (ref 11.5–15.5)
WBC: 11 10*3/uL — ABNORMAL HIGH (ref 4.0–10.5)

## 2013-04-29 LAB — COMPREHENSIVE METABOLIC PANEL WITH GFR
ALT: 14 U/L (ref 0–35)
AST: 32 U/L (ref 0–37)
Albumin: 2.7 g/dL — ABNORMAL LOW (ref 3.5–5.2)
Alkaline Phosphatase: 91 U/L (ref 39–117)
BUN: 40 mg/dL — ABNORMAL HIGH (ref 6–23)
CO2: 24 meq/L (ref 19–32)
Calcium: 7.8 mg/dL — ABNORMAL LOW (ref 8.4–10.5)
Chloride: 103 meq/L (ref 96–112)
Creatinine, Ser: 1.34 mg/dL — ABNORMAL HIGH (ref 0.50–1.10)
GFR calc Af Amer: 38 mL/min — ABNORMAL LOW
GFR calc non Af Amer: 33 mL/min — ABNORMAL LOW
Glucose, Bld: 110 mg/dL — ABNORMAL HIGH (ref 70–99)
Potassium: 4 meq/L (ref 3.7–5.3)
Sodium: 141 meq/L (ref 137–147)
Total Bilirubin: 0.4 mg/dL (ref 0.3–1.2)
Total Protein: 6.3 g/dL (ref 6.0–8.3)

## 2013-04-29 LAB — URINE CULTURE
COLONY COUNT: NO GROWTH
Colony Count: NO GROWTH
Culture: NO GROWTH
Culture: NO GROWTH

## 2013-04-29 LAB — TSH: TSH: 1.002 u[IU]/mL (ref 0.350–4.500)

## 2013-04-29 LAB — MRSA PCR SCREENING: MRSA by PCR: NEGATIVE

## 2013-04-29 MED ORDER — MORPHINE SULFATE 2 MG/ML IJ SOLN
1.0000 mg | INTRAMUSCULAR | Status: DC | PRN
  Administered 2013-04-30 – 2013-05-01 (×4): 1 mg via INTRAVENOUS
  Filled 2013-04-29 (×4): qty 1

## 2013-04-29 MED ORDER — ENOXAPARIN SODIUM 30 MG/0.3ML ~~LOC~~ SOLN
20.0000 mg | Freq: Every day | SUBCUTANEOUS | Status: DC
  Administered 2013-04-30: 14:00:00 20 mg via SUBCUTANEOUS
  Filled 2013-04-29 (×4): qty 0.2

## 2013-04-29 MED ORDER — OSELTAMIVIR PHOSPHATE 6 MG/ML PO SUSR
30.0000 mg | Freq: Every day | ORAL | Status: DC
  Administered 2013-04-30 – 2013-05-01 (×2): 30 mg via ORAL
  Filled 2013-04-29 (×3): qty 5

## 2013-04-29 MED ORDER — ACETAMINOPHEN 650 MG RE SUPP
650.0000 mg | RECTAL | Status: DC | PRN
  Administered 2013-04-29: 22:00:00 650 mg via RECTAL
  Filled 2013-04-29: qty 1

## 2013-04-29 MED ORDER — OSELTAMIVIR PHOSPHATE 30 MG PO CAPS
30.0000 mg | ORAL_CAPSULE | Freq: Every morning | ORAL | Status: DC
  Administered 2013-04-29: 30 mg via ORAL
  Filled 2013-04-29: qty 1

## 2013-04-29 NOTE — ED Provider Notes (Addendum)
I saw and evaluated the patient, reviewed the resident's note and I agree with the findings and plan.   .Face to face Exam:  General:  nonresponsive HEENT:  Atraumatic Resp: respiratory distress Abd:  Nondistended Neuro:  unable   CRITICAL CARE Performed by: Carollynn Pennywell L Total critical care time: 45 min Critical care time was exclusive of separately billable procedures and treating other patients. Critical care was necessary to treat or prevent imminent or life-threatening deterioration. Critical care was time spent personally by me on the following activities: development of treatment plan with patient and/or surrogate as well as nursing, discussions with consultants, evaluation of patient's response to treatment, examination of patient, obtaining history from patient or surrogate, ordering and performing treatments and interventions, ordering and review of laboratory studies, ordering and review of radiographic studies, pulse oximetry and re-evaluation of patient's condition.   Nelia Shiobert L Emmit Oriley, MD 04/29/13 2135

## 2013-04-29 NOTE — Care Management Note (Addendum)
    Page 1 of 1   04/28/2013     11:50:19 AM   CARE MANAGEMENT NOTE 05/05/2013  Patient:  Mosley,Makella   Account Number:  [id number]  Date Initiated:  04/29/2013  Documentation initiated by:  Junius CreamerWELL,DEBBIE  Subjective/Objective Assessment:   adm w sepsis     Action/Plan:   cared for by son and da   Anticipated DC Date:  2013-12-13   Anticipated DC Plan:    In-house referral  Hospice / Palliative Care      DC Planning Services  CM consult      Choice offered to / List presented to:             Status of service:  In process, will continue to follow Medicare Important Message given?   (If response is "NO", the following Medicare IM given date fields will be blank) Date Medicare IM given:   Date Additional Medicare IM given:    Discharge Disposition:    Per UR Regulation:  Reviewed for med. necessity/level of care/duration of stay  If discussed at Long Length of Stay Meetings, dates discussed:    Comments:  05/02/2013 11:03 Letha Capeeborah Adriona Kaney RN, BSN [phone] Patient is actively dying per MD,  NCM consulted Rose with Hospice to see if patient would be GIP eligible, NCM has not spoke to family yet.  Rose states she will get with Carley Hammedva and they will get back with me to let me know if patient is appropirate.

## 2013-04-29 NOTE — Progress Notes (Signed)
Pt taken off BIPAP to assess for need for continuous BIPAP. Pt placed on 4lpm [place] and tolerating well at this time. RT will continue to monitor.

## 2013-04-29 NOTE — Progress Notes (Signed)
[facility] PROGRESS NOTE  Jessica Oneal ZOX:[id number] DOB: [birth year] DOA: 04/28/2013 PCP: Pcp Not In System  Assessment/Plan:  Principal Problem:   Influenza A Active Problems:   Sepsis   Acute respiratory failure  Off bipap, but tachypneic. No longer hypotensive. Per RN, had a difficult time swallowing tamiflu.  Prognosis poor. Discussed with family. Transfer to floor. Continue tamiflu, but concentrate on comfort measures.  Code Status:  DNR Family Communication:  Son and daughter Disposition Plan:  ?  HPI/Subjective:  Unable  Objective: Filed Vitals:   04/29/13 0957  BP:   Pulse: 87  Temp:   Resp: 30    Intake/Output Summary (Last 24 hours) at 04/29/13 1140 Last data filed at 04/29/13 1100  Gross per 24 hour  Intake    273 ml  Output    350 ml  Net    -77 ml   Filed Weights   04/28/13 1645 04/28/13 1930  Weight: 36.288 kg (80 lb) 36.1 kg (79 lb 9.4 oz)    Exam:   General:  Tachypneic. Nonverbal. Opens eyes to voice.  Cardiovascular: RRR without MGR  Respiratory: tachypneic, diminished  Abdomen: s, not, nd  Ext: no cce  Basic Metabolic Panel:  Recent Labs Lab 04/28/13 1726 04/28/13 1900 04/29/13 0600  NA 138  --  141  K 4.1  --  4.0  CL 101  --  103  CO2  --   --  24  GLUCOSE 185*  --  110*  BUN 42*  --  40*  CREATININE 1.70* 1.53* 1.34*  CALCIUM  --   --  7.8*  MG  --  2.0  --    Liver Function Tests:  Recent Labs Lab 04/28/13 1900 04/29/13 0600  AST 31 32  ALT 13 14  ALKPHOS 86 91  BILITOT 0.3 0.4  PROT 6.4 6.3  ALBUMIN 2.9* 2.7*   No results found for this basename: LIPASE, AMYLASE,  in the last 168 hours No results found for this basename: AMMONIA,  in the last 168 hours CBC:  Recent Labs Lab 04/28/13 1712 04/28/13 1726 04/28/13 1900 04/29/13 0600  WBC 11.8*  --  7.2 11.0*  NEUTROABS 9.0*  --   --   --   HGB 11.6* 11.9* 10.7* 10.5*  HCT 35.1* 35.0* 31.8* 31.2*  MCV 92.9  --  92.2 90.7  PLT 199  --  136*  162   Cardiac Enzymes: No results found for this basename: CKTOTAL, CKMB, CKMBINDEX, TROPONINI,  in the last 168 hours BNP (last 3 results)  Recent Labs  04/28/13 1712  PROBNP 9395.0*   CBG: No results found for this basename: GLUCAP,  in the last 168 hours  Recent Results (from the past 240 hour(s))  MRSA PCR SCREENING     Status: None   Collection Time    04/28/13  7:39 PM      Result Value Range Status   MRSA by PCR NEGATIVE  NEGATIVE Final   Comment:            The GeneXpert MRSA Assay (FDA     approved for NASAL specimens     only), is one component of a     comprehensive MRSA colonization     surveillance program. It is not     intended to diagnose MRSA     infection nor to guide or     monitor treatment for     MRSA infections.     Studies: Dg Chest 1  View  04/28/2013   CLINICAL DATA:  Shortness of breath.  EXAM: CHEST - 1 VIEW  COMPARISON:  02/19/2009.  FINDINGS: The cardiac silhouette, mediastinal and hilar contours are stable. There is tortuosity and calcification of the thoracic aorta. Chronic emphysematous changes with probable superimposed bronchitis. No focal airspace consolidation or pleural effusion. The bony thorax is intact.  IMPRESSION: Emphysema with low lung volumes.  Suspect overlying bronchitis.   Electronically Signed   By: Loralie Champagne M.D.   On: 04/28/2013 16:19   Portable Chest 1 View  04/29/2013   CLINICAL DATA:  Pneumonia .  EXAM: PORTABLE CHEST - 1 VIEW  COMPARISON:  04/28/2013.  02/19/2009.  FINDINGS: Mediastinum and hilar structures are normal. Changes suggesting COPD noted. Mild bilateral interstitial prominence noted. These findings suggest pneumonitis. Heart size and pulmonary vascularity normal. No pleural effusion pneumothorax. No acute osseous abnormality.  IMPRESSION: 1. Persistent interstitial prominence suggesting bilateral pneumonitis. 2. COPD.   Electronically Signed   By: Maisie Fus  Register   On: 04/29/2013 07:11   Dg Abd Portable  1v  04/28/2013   CLINICAL DATA:  Diarrhea.  EXAM: PORTABLE ABDOMEN - 1 VIEW  COMPARISON:  Abdominal radiograph 09/06/2008.  FINDINGS: Gas and stool are noted throughout the colon extending to the level of the distal rectum. Multiple nondilated gas-filled loops of small bowel project over the central abdomen. No definite pathologic dilatation of small bowel. No gross evidence of pneumoperitoneum on this single supine view. A rectal thermistor noted.  IMPRESSION: 1. Nonspecific, nonobstructive bowel gas pattern. 2. No pneumoperitoneum.   Electronically Signed   By: Trudie Reed M.D.   On: 04/28/2013 19:09    Scheduled Meds: . [START ON 04/30/2013] enoxaparin (LOVENOX) injection  20 mg Subcutaneous Daily  . oseltamivir  75 mg Oral BID  . sodium chloride  3 mL Intravenous Q12H   Continuous Infusions: . sodium chloride      Time spent: 35 minutes  [name] L  [facility] Pager [phone]. If 7PM-7AM, please contact night-coverage at [email or web address], password [facility] 04/29/2013, 11:40 AM  LOS: 1 day

## 2013-04-30 MED ORDER — CHLORHEXIDINE GLUCONATE 0.12 % MT SOLN
15.0000 mL | Freq: Two times a day (BID) | OROMUCOSAL | Status: DC
  Administered 2013-04-30 – 2013-05-01 (×3): 15 mL via OROMUCOSAL
  Filled 2013-04-30 (×5): qty 15

## 2013-04-30 MED ORDER — SODIUM CHLORIDE 0.9 % IV SOLN
INTRAVENOUS | Status: DC
  Administered 2013-04-30: 17:00:00 via INTRAVENOUS

## 2013-04-30 MED ORDER — BIOTENE DRY MOUTH MT LIQD
15.0000 mL | Freq: Two times a day (BID) | OROMUCOSAL | Status: DC
  Administered 2013-04-30 – 2013-05-01 (×3): 15 mL via OROMUCOSAL

## 2013-04-30 MED ORDER — RESOURCE THICKENUP CLEAR PO POWD
ORAL | Status: DC | PRN
  Filled 2013-04-30: qty 125

## 2013-04-30 NOTE — Progress Notes (Signed)
Pt restless and fidgety. Granddaughter asked about "something to help her feel more comfortable." Discussed use of prn morphine. Granddaughter agreeable . Will continue to monitor pt.

## 2013-04-30 NOTE — Progress Notes (Addendum)
[facility] PROGRESS NOTE  Jessica Oneal UJW:[id number] DOB: 26-Jan-1916 DOA: 04/28/2013 PCP: Pcp Not In System  Assessment/Plan:  Principal Problem:   Influenza A Active Problems:   Sepsis   Acute respiratory failure  Somewhat better. Start pureed diet and consult ST. Continue tamiflu, nebs, oxygen. Prognosis remains guarded and family aware.  Code Status:  DNR Family Communication:  Granddaughter. Disposition Plan:  ?  HPI/Subjective:  Unable  Objective: Filed Vitals:   04/30/13 1431  BP: 149/53  Pulse: 77  Temp: 97.3 F (36.3 C)  Resp: 24    Intake/Output Summary (Last 24 hours) at 04/30/13 1622 Last data filed at 04/30/13 1100  Gross per 24 hour  Intake      0 ml  Output      0 ml  Net      0 ml   Filed Weights   04/28/13 1645 04/28/13 1930  Weight: 36.288 kg (80 lb) 36.1 kg (79 lb 9.4 oz)    Exam:   General:  Slightly less tachypneic. More alert today. NRB mask  Cardiovascular: RRR without MGR  Respiratory: mild accessory muscle use  Abdomen: s, not, nd  Ext: no cce  Basic Metabolic Panel:  Recent Labs Lab 04/28/13 1726 04/28/13 1900 04/29/13 0600  NA 138  --  141  K 4.1  --  4.0  CL 101  --  103  CO2  --   --  24  GLUCOSE 185*  --  110*  BUN 42*  --  40*  CREATININE 1.70* 1.53* 1.34*  CALCIUM  --   --  7.8*  MG  --  2.0  --    Liver Function Tests:  Recent Labs Lab 04/28/13 1900 04/29/13 0600  AST 31 32  ALT 13 14  ALKPHOS 86 91  BILITOT 0.3 0.4  PROT 6.4 6.3  ALBUMIN 2.9* 2.7*   No results found for this basename: LIPASE, AMYLASE,  in the last 168 hours No results found for this basename: AMMONIA,  in the last 168 hours CBC:  Recent Labs Lab 04/28/13 1712 04/28/13 1726 04/28/13 1900 04/29/13 0600  WBC 11.8*  --  7.2 11.0*  NEUTROABS 9.0*  --   --   --   HGB 11.6* 11.9* 10.7* 10.5*  HCT 35.1* 35.0* 31.8* 31.2*  MCV 92.9  --  92.2 90.7  PLT 199  --  136* 162   Cardiac Enzymes: No results found for this  basename: CKTOTAL, CKMB, CKMBINDEX, TROPONINI,  in the last 168 hours BNP (last 3 results)  Recent Labs  04/28/13 1712  PROBNP 9395.0*   CBG: No results found for this basename: GLUCAP,  in the last 168 hours  Recent Results (from the past 240 hour(s))  URINE CULTURE     Status: None   Collection Time    04/28/13  4:34 PM      Result Value Range Status   Specimen Description URINE, CATHETERIZED   Final   Special Requests NONE   Final   Culture  Setup Time     Final   Value: 04/28/2013 18:45     Performed at [facility] Count     Final   Value: NO GROWTH     Performed at [facility]   Culture     Final   Value: NO GROWTH     Performed at [facility]   Report Status 04/29/2013 FINAL   Final  CULTURE, BLOOD (ROUTINE X 2)  Status: None   Collection Time    04/28/13  5:13 PM      Result Value Range Status   Specimen Description BLOOD RIGHT ANTECUBITAL   Final   Special Requests     Final   Value: BOTTLES DRAWN AEROBIC AND ANAEROBIC 10CC BLUE, 5CC RED   Culture  Setup Time     Final   Value: 04/29/2013 00:28     Performed at [facility]   Culture     Final   Value:        BLOOD CULTURE RECEIVED NO GROWTH TO DATE CULTURE WILL BE HELD FOR 5 DAYS BEFORE ISSUING A FINAL NEGATIVE REPORT     Performed at [facility]   Report Status PENDING   Incomplete  CULTURE, BLOOD (ROUTINE X 2)     Status: None   Collection Time    04/28/13  5:13 PM      Result Value Range Status   Specimen Description BLOOD RIGHT HAND   Final   Special Requests BOTTLES DRAWN AEROBIC AND ANAEROBIC 10CC   Final   Culture  Setup Time     Final   Value: 04/29/2013 00:29     Performed at [facility]   Culture     Final   Value:        BLOOD CULTURE RECEIVED NO GROWTH TO DATE CULTURE WILL BE HELD FOR 5 DAYS BEFORE ISSUING A FINAL NEGATIVE REPORT     Performed at [facility]   Report Status PENDING   Incomplete  CULTURE, BLOOD (ROUTINE  X 2)     Status: None   Collection Time    04/28/13  7:25 PM      Result Value Range Status   Specimen Description BLOOD HAND RIGHT   Final   Special Requests BOTTLES DRAWN AEROBIC ONLY 5CC   Final   Culture  Setup Time     Final   Value: 04/29/2013 01:32     Performed at [facility]   Culture     Final   Value:        BLOOD CULTURE RECEIVED NO GROWTH TO DATE CULTURE WILL BE HELD FOR 5 DAYS BEFORE ISSUING A FINAL NEGATIVE REPORT     Performed at [facility]   Report Status PENDING   Incomplete  CULTURE, BLOOD (ROUTINE X 2)     Status: None   Collection Time    04/28/13  7:25 PM      Result Value Range Status   Specimen Description BLOOD HAND LEFT   Final   Special Requests BOTTLES DRAWN AEROBIC ONLY 5CC   Final   Culture  Setup Time     Final   Value: 04/29/2013 01:31     Performed at [facility]   Culture     Final   Value:        BLOOD CULTURE RECEIVED NO GROWTH TO DATE CULTURE WILL BE HELD FOR 5 DAYS BEFORE ISSUING A FINAL NEGATIVE REPORT     Performed at [facility]   Report Status PENDING   Incomplete  MRSA PCR SCREENING     Status: None   Collection Time    04/28/13  7:39 PM      Result Value Range Status   MRSA by PCR NEGATIVE  NEGATIVE Final   Comment:            The GeneXpert MRSA Assay (FDA     approved  for NASAL specimens     only), is one component of a     comprehensive MRSA colonization     surveillance program. It is not     intended to diagnose MRSA     infection nor to guide or     monitor treatment for     MRSA infections.  URINE CULTURE     Status: None   Collection Time    04/28/13  9:36 PM      Result Value Range Status   Specimen Description URINE, CATHETERIZED   Final   Special Requests CX ADDED AT 2208 ON [id number]   Final   Culture  Setup Time     Final   Value: 04/29/2013 03:08     [facility]   Colony Count     Final   Value: NO GROWTH     [facility]   Culture      Final   Value: NO GROWTH     [facility]   Report Status 04/29/2013 FINAL   Final     Studies: Portable Chest 1 View  04/29/2013   CLINICAL DATA:  Pneumonia .  EXAM: PORTABLE CHEST - 1 VIEW  COMPARISON:  04/28/2013.  02/19/2009.  FINDINGS: Mediastinum and hilar structures are normal. Changes suggesting COPD noted. Mild bilateral interstitial prominence noted. These findings suggest pneumonitis. Heart size and pulmonary vascularity normal. No pleural effusion pneumothorax. No acute osseous abnormality.  IMPRESSION: 1. Persistent interstitial prominence suggesting bilateral pneumonitis. 2. COPD.   Electronically Signed   By: Maisie Fushomas  Register   On: 04/29/2013 07:11   Dg Abd Portable 1v  04/28/2013   CLINICAL DATA:  Diarrhea.  EXAM: PORTABLE ABDOMEN - 1 VIEW  COMPARISON:  Abdominal radiograph 09/06/2008.  FINDINGS: Gas and stool are noted throughout the colon extending to the level of the distal rectum. Multiple nondilated gas-filled loops of small bowel project over the central abdomen. No definite pathologic dilatation of small bowel. No gross evidence of pneumoperitoneum on this single supine view. A rectal thermistor noted.  IMPRESSION: 1. Nonspecific, nonobstructive bowel gas pattern. 2. No pneumoperitoneum.   Electronically Signed   By: Trudie Reedaniel  Entrikin M.D.   On: 04/28/2013 19:09    Scheduled Meds: . antiseptic oral rinse  15 mL Mouth Rinse q12n4p  . chlorhexidine  15 mL Mouth Rinse BID  . enoxaparin (LOVENOX) injection  20 mg Subcutaneous Daily  . oseltamivir  30 mg Oral Daily  . sodium chloride  3 mL Intravenous Q12H   Continuous Infusions: . sodium chloride      Time spent: 25 minutes  Norvin Ohlin L  [facility] Pager [phone]. If 7PM-7AM, please contact night-coverage at [email or web address], password [email or web address] 04/30/2013, 4:22 PM  LOS: 2 days

## 2013-04-30 NOTE — Progress Notes (Signed)
NURSING PROGRESS NOTE  Jessica Oneal [id number] Transfer Data: 04/30/2013 6:16 AM Attending Provider: Christiane Haorinna L Sullivan, MD PCP:Pcp Not In System Code Status: DNR  Jessica Oneal is a 80 y.o. female patient transferred from 2900 -No acute distress noted.  -No complaints of shortness of breath.  -No complaints of chest pain.   Cardiac Monitoring: Box #N/A Cardiac monitor yields;N/A.  Blood pressure 157/49, pulse 72, temperature 97.3 F (36.3 C), temperature source Axillary, resp. rate 28, height 5\' 1"  (1.549 m), weight 36.1 kg (79 lb 9.4 oz), SpO2 91.00%.   IV Fluids:  IV in place, occlusive dsg intact without redness, IV cath hand right, condition patent and no redness  .   Allergies:  Review of patient's allergies indicates no known allergies.  Past Medical History:   has a past medical history of Dementia.  Past Surgical History:   has past surgical history that includes No past surgeries.  Social History:   reports that she has never smoked. She has never used smokeless tobacco. She reports that she does not drink alcohol or use illicit drugs.  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 Information packet given to patient/family. Admission inpatient armband information verified with patient/family to include name and date of birth and placed on patient arm. Side rails up x 2, fall assessment and education completed with patient/family. Patient/family able to verbalize understanding of risk associated with falls and verbalized understanding to call for assistance before getting out of bed. Call light within reach. Patient/family able to voice and demonstrate understanding of unit orientation instructions.    Will continue to evaluate and treat per MD orders.

## 2013-05-01 MED ORDER — LORAZEPAM 2 MG/ML IJ SOLN: 1.0000 mg | INTRAMUSCULAR | Status: DC | PRN

## 2013-05-01 MED ORDER — ENOXAPARIN SODIUM 30 MG/0.3ML ~~LOC~~ SOLN: 20.0000 mg | Freq: Every day | SUBCUTANEOUS | Status: DC

## 2013-05-01 MED ORDER — MORPHINE SULFATE 2 MG/ML IJ SOLN: 2.0000 mg | INTRAMUSCULAR | Status: DC | PRN

## 2013-05-01 MED ORDER — ATROPINE SULFATE 1 % OP SOLN
2.0000 [drp] | OPHTHALMIC | Status: DC | PRN
Start: 2013-05-01 — End: 2013-05-02
  Filled 2013-05-01: qty 2

## 2013-05-01 MED ORDER — SODIUM CHLORIDE 0.9 % IV SOLN
0.2500 mg/h | INTRAVENOUS | Status: DC
  Administered 2013-05-01: 20:00:00 0.25 mg/h via INTRAVENOUS
  Filled 2013-05-01: qty 10

## 2013-05-01 MED ORDER — SCOPOLAMINE 1 MG/3DAYS TD PT72
1.0000 | MEDICATED_PATCH | TRANSDERMAL | Status: DC
  Administered 2013-05-01: 1.5 mg via TRANSDERMAL
  Filled 2013-05-01: qty 1

## 2013-05-01 NOTE — Progress Notes (Signed)
Met with patient's children and multiple grandchildren.  Full Comfort Change to nasal cannula Morphine infusion Anticipate hospital death.  Full note to follow.  Lane Hacker, DO Palliative Medicine

## 2013-05-01 NOTE — Progress Notes (Signed)
[facility] PROGRESS NOTE  Jessica Oneal WJX:[id number] DOB: [birth year] DOA: 04/28/2013 PCP: Pcp Not In System  Assessment/Plan: 1. Sepsis, present on admission, evidenced by hypotension, code likelihood of a 5.67, acute encephalopathy, metabolic acidosis. Chest x-ray showing bilateral pneumonitis. Urine was negative for nitrates and leukocyte esterase. She did test positive for influenza A. 2. Acute encephalopathy. Likely secondary to sepsis. Patient having a significant decline in the last 24 hours. 3. Acute renal failure, in setting of sepsis. Patient's creatinine coming down from 1.7 on 04/28/2048 to 1.34 on 04/29/2048 with IV fluid resuscitation. 4. Goals of care. Given patient's significant clinical deterioration goals of care discussion was held with patient's daughter and granddaughters at bedside regarding goals of care. Patient's disease process likely advanced, despite current interventions at she has continued to decline. Family members expressed a desire to focus patient's care on comfort, minimizing potential burdensome or invasive procedures. She is a DO NOT RESUSCITATE. Will formally consult palliative care.  Code Status: DO NOT RESUSCITATE Family Communication: Had a family meeting today discussing goals of care Disposition Plan: Transition to comfort measures, palliative care consult.   Consultants:  Palliative care    HPI/Subjective: Patient is a pleasant 80 year old female with a past medical history of cognitive impairment, admitted to the medicine service on 04/28/2013 which she presented with mental status changes, found to be unresponsive initially. She presented with hypotension, metabolic acidosis with a pH of 7.135. Goals of care were discussed with family members, as she was given supportive care, started on Tamiflu for influenza A., Administered IV fluids. Despite this intervention, patient has had a significant decline since yesterday. She is now minimally  responsive, having agonal breaths. Family wishes to focus our care on comfort.   Objective: Filed Vitals:   04/30/13 2120  BP: 142/60  Pulse: 73  Temp: 97.3 F (36.3 C)  Resp: 24    Intake/Output Summary (Last 24 hours) at 05/01/13 1658 Last data filed at 05/01/13 1400  Gross per 24 hour  Intake 426.67 ml  Output      0 ml  Net 426.67 ml   Filed Weights   04/28/13 1645 04/28/13 1930  Weight: 36.288 kg (80 lb) 36.1 kg (79 lb 9.4 oz)    Exam:   General:  Patient demonstrated agonal breaths, unresponsive  Cardiovascular: Tachycardic, regular rate rhythm normal S1-S2  Respiratory: Extensive rales, crackles, coarse respiratory sounds bilaterally. Tachypneic  Abdomen: Soft nontender nondistended  Musculoskeletal: Muscle atrophy noted, no edema  Data Reviewed: Basic Metabolic Panel:  Recent Labs Lab 04/28/13 1726 04/28/13 1900 04/29/13 0600  NA 138  --  141  K 4.1  --  4.0  CL 101  --  103  CO2  --   --  24  GLUCOSE 185*  --  110*  BUN 42*  --  40*  CREATININE 1.70* 1.53* 1.34*  CALCIUM  --   --  7.8*  MG  --  2.0  --    Liver Function Tests:  Recent Labs Lab 04/28/13 1900 04/29/13 0600  AST 31 32  ALT 13 14  ALKPHOS 86 91  BILITOT 0.3 0.4  PROT 6.4 6.3  ALBUMIN 2.9* 2.7*   No results found for this basename: LIPASE, AMYLASE,  in the last 168 hours No results found for this basename: AMMONIA,  in the last 168 hours CBC:  Recent Labs Lab 04/28/13 1712 04/28/13 1726 04/28/13 1900 04/29/13 0600  WBC 11.8*  --  7.2 11.0*  NEUTROABS 9.0*  --   --   --  HGB 11.6* 11.9* 10.7* 10.5*  HCT 35.1* 35.0* 31.8* 31.2*  MCV 92.9  --  92.2 90.7  PLT 199  --  136* 162   Cardiac Enzymes: No results found for this basename: CKTOTAL, CKMB, CKMBINDEX, TROPONINI,  in the last 168 hours BNP (last 3 results)  Recent Labs  04/28/13 1712  PROBNP 9395.0*   CBG: No results found for this basename: GLUCAP,  in the last 168 hours  Recent Results (from the  past 240 hour(s))  URINE CULTURE     Status: None   Collection Time    04/28/13  4:34 PM      Result Value Range Status   Specimen Description URINE, CATHETERIZED   Final   Special Requests NONE   Final   Culture  Setup Time     Final   Value: 04/28/2013 18:45     Performed at [facility] Count     Final   Value: NO GROWTH     Performed at [facility]   Culture     Final   Value: NO GROWTH     Performed at [facility]   Report Status 04/29/2013 FINAL   Final  CULTURE, BLOOD (ROUTINE X 2)     Status: None   Collection Time    04/28/13  5:13 PM      Result Value Range Status   Specimen Description BLOOD RIGHT ANTECUBITAL   Final   Special Requests     Final   Value: BOTTLES DRAWN AEROBIC AND ANAEROBIC 10CC BLUE, 5CC RED   Culture  Setup Time     Final   Value: 04/29/2013 00:28     Performed at [facility]   Culture     Final   Value:        BLOOD CULTURE RECEIVED NO GROWTH TO DATE CULTURE WILL BE HELD FOR 5 DAYS BEFORE ISSUING A FINAL NEGATIVE REPORT     Performed at [facility]   Report Status PENDING   Incomplete  CULTURE, BLOOD (ROUTINE X 2)     Status: None   Collection Time    04/28/13  5:13 PM      Result Value Range Status   Specimen Description BLOOD RIGHT HAND   Final   Special Requests BOTTLES DRAWN AEROBIC AND ANAEROBIC 10CC   Final   Culture  Setup Time     Final   Value: 04/29/2013 00:29     Performed at [facility]   Culture     Final   Value:        BLOOD CULTURE RECEIVED NO GROWTH TO DATE CULTURE WILL BE HELD FOR 5 DAYS BEFORE ISSUING A FINAL NEGATIVE REPORT     Performed at [facility]   Report Status PENDING   Incomplete  CULTURE, BLOOD (ROUTINE X 2)     Status: None   Collection Time    04/28/13  7:25 PM      Result Value Range Status   Specimen Description BLOOD HAND RIGHT   Final   Special Requests BOTTLES DRAWN AEROBIC ONLY 5CC   Final   Culture  Setup Time     Final   Value:  04/29/2013 01:32     Performed at [facility]   Culture     Final   Value:        BLOOD CULTURE RECEIVED NO GROWTH TO DATE CULTURE WILL BE HELD FOR 5 DAYS  BEFORE ISSUING A FINAL NEGATIVE REPORT     Performed at [facility]   Report Status PENDING   Incomplete  CULTURE, BLOOD (ROUTINE X 2)     Status: None   Collection Time    04/28/13  7:25 PM      Result Value Range Status   Specimen Description BLOOD HAND LEFT   Final   Special Requests BOTTLES DRAWN AEROBIC ONLY 5CC   Final   Culture  Setup Time     Final   Value: 04/29/2013 01:31     Performed at [facility]   Culture     Final   Value:        BLOOD CULTURE RECEIVED NO GROWTH TO DATE CULTURE WILL BE HELD FOR 5 DAYS BEFORE ISSUING A FINAL NEGATIVE REPORT     Performed at [facility]   Report Status PENDING   Incomplete  MRSA PCR SCREENING     Status: None   Collection Time    04/28/13  7:39 PM      Result Value Range Status   MRSA by PCR NEGATIVE  NEGATIVE Final   Comment:            The GeneXpert MRSA Assay (FDA     approved for NASAL specimens     only), is one component of a     comprehensive MRSA colonization     surveillance program. It is not     intended to diagnose MRSA     infection nor to guide or     monitor treatment for     MRSA infections.  URINE CULTURE     Status: None   Collection Time    04/28/13  9:36 PM      Result Value Range Status   Specimen Description URINE, CATHETERIZED   Final   Special Requests CX ADDED AT 2208 ON 161096   Final   Culture  Setup Time     Final   Value: 04/29/2013 03:08     Performed at [facility]   Colony Count     Final   Value: NO GROWTH     Performed at [facility]   Culture     Final   Value: NO GROWTH     Performed at [facility]   Report Status 04/29/2013 FINAL   Final     Studies: No results found.  Scheduled Meds: . antiseptic oral rinse  15 mL Mouth Rinse q12n4p  . chlorhexidine  15 mL Mouth  Rinse BID  . sodium chloride  3 mL Intravenous Q12H   Continuous Infusions: . sodium chloride 20 mL/hr at 04/30/13 1640    Principal Problem:   Influenza A Active Problems:   Sepsis   Acute respiratory failure    Time spent: 40 minutes    Jeralyn Bennett  [facility] Pager [phone]. If 7PM-7AM, please contact night-coverage at [email or web address], password [facility] 05/01/2013, 4:58 PM  LOS: 3 days

## 2013-05-01 NOTE — Progress Notes (Signed)
Patient's O2 was 74% on 2.5L. Turned patient's O2 up to 6L via nasal canula, O2 went up to 83% on 6L. Patient's son at bedside states that he doesn't want to put venti mask back on patient. Son states she looks more comfortable without the O2 mask. Patient is in no distress at this time. Will continue O2 via nasal cannula at 6L. Will continue to monitor patient. Nelda MarseilleJenny Thacker, RN

## 2013-05-01 NOTE — Progress Notes (Signed)
Patient's son states that he doesn't want staff to turn his mother because she is resting comfortably. Will continue to monitor patient. Nelda MarseilleJenny Thacker, RN

## 2013-05-02 ENCOUNTER — Inpatient Hospital Stay (HOSPITAL_COMMUNITY)
Admission: AD | Admit: 2013-05-02 | Discharge: 2013-05-29 | DRG: 871 | Disposition: E | Source: Ambulatory Visit | Attending: Internal Medicine | Admitting: Internal Medicine

## 2013-05-02 DIAGNOSIS — F0390 Unspecified dementia without behavioral disturbance: Secondary | ICD-10-CM | POA: Diagnosis present

## 2013-05-02 DIAGNOSIS — J9600 Acute respiratory failure, unspecified whether with hypoxia or hypercapnia: Secondary | ICD-10-CM | POA: Diagnosis present

## 2013-05-02 DIAGNOSIS — R5381 Other malaise: Secondary | ICD-10-CM | POA: Diagnosis present

## 2013-05-02 DIAGNOSIS — R627 Adult failure to thrive: Secondary | ICD-10-CM | POA: Diagnosis present

## 2013-05-02 DIAGNOSIS — Z515 Encounter for palliative care: Secondary | ICD-10-CM

## 2013-05-02 DIAGNOSIS — R6520 Severe sepsis without septic shock: Secondary | ICD-10-CM

## 2013-05-02 DIAGNOSIS — A419 Sepsis, unspecified organism: Principal | ICD-10-CM | POA: Diagnosis present

## 2013-05-02 DIAGNOSIS — G9340 Encephalopathy, unspecified: Secondary | ICD-10-CM

## 2013-05-02 DIAGNOSIS — N179 Acute kidney failure, unspecified: Secondary | ICD-10-CM

## 2013-05-02 DIAGNOSIS — R1310 Dysphagia, unspecified: Secondary | ICD-10-CM | POA: Diagnosis present

## 2013-05-02 DIAGNOSIS — J111 Influenza due to unidentified influenza virus with other respiratory manifestations: Secondary | ICD-10-CM | POA: Diagnosis present

## 2013-05-02 MED ORDER — ACETAMINOPHEN 650 MG RE SUPP: 650.0000 mg | RECTAL | Status: DC | PRN

## 2013-05-02 MED ORDER — ATROPINE SULFATE 1 % OP SOLN
2.0000 [drp] | OPHTHALMIC | Status: DC | PRN
  Filled 2013-05-02: qty 2

## 2013-05-02 MED ORDER — SCOPOLAMINE 1 MG/3DAYS TD PT72: 1.0000 | MEDICATED_PATCH | TRANSDERMAL | Status: AC

## 2013-05-02 MED ORDER — ATROPINE SULFATE 1 % OP SOLN: 2.0000 [drp] | OPHTHALMIC | Status: AC | PRN

## 2013-05-02 MED ORDER — LORAZEPAM 2 MG/ML IJ SOLN: 1.0000 mg | INTRAMUSCULAR | Status: AC | PRN

## 2013-05-02 MED ORDER — MORPHINE SULFATE 2 MG/ML IJ SOLN: 2.0000 mg | INTRAMUSCULAR | Status: AC | PRN

## 2013-05-02 MED ORDER — SODIUM CHLORIDE 0.9 % IV SOLN
250.0000 mL | INTRAVENOUS | Status: DC | PRN
  Administered 2013-05-02: 19:00:00 250 mL via INTRAVENOUS

## 2013-05-02 MED ORDER — BIOTENE DRY MOUTH MT LIQD: 15.0000 mL | Freq: Two times a day (BID) | OROMUCOSAL | Status: DC

## 2013-05-02 MED ORDER — SODIUM CHLORIDE 0.9 % IJ SOLN
3.0000 mL | Freq: Two times a day (BID) | INTRAMUSCULAR | Status: DC
  Administered 2013-05-02: 23:00:00 3 mL via INTRAVENOUS

## 2013-05-02 MED ORDER — SODIUM CHLORIDE 0.9 % IV SOLN
0.2500 mg/h | INTRAVENOUS | Status: DC
  Administered 2013-05-02: 0.25 mg/h via INTRAVENOUS
  Filled 2013-05-02: qty 10

## 2013-05-02 MED ORDER — MORPHINE SULFATE 2 MG/ML IJ SOLN: 2.0000 mg | INTRAMUSCULAR | Status: DC | PRN

## 2013-05-02 MED ORDER — SCOPOLAMINE 1 MG/3DAYS TD PT72: 1.0000 | MEDICATED_PATCH | TRANSDERMAL | Status: DC

## 2013-05-02 MED ORDER — LORAZEPAM 2 MG/ML IJ SOLN: 1.0000 mg | INTRAMUSCULAR | Status: DC | PRN

## 2013-05-02 NOTE — Progress Notes (Signed)
Continuous Morphine 1mg /mL bag expired. Wasted 84.5mL in bag with Ginger Gleason, RN as witness. Tubing from previous bag remained in pt's room and used for new bag hung tonight.

## 2013-05-02 NOTE — Progress Notes (Signed)
Speech Language Pathology Discharge Patient Details Name: Jessica Oneal MRN: [id number] DOB: [birth year] Today's Date: 05/12/2013 Time:  -     Patient discharged from SLP services secondary to Pt. full comfort care following Palliative Care consult.  Discussed with MD to D/C ST.  Please see latest therapy progress note for current level of functioning and progress toward goals.    Progress and discharge plan discussed with patient and/or caregiver: Not discussed with pt/family  GO     Royce MacadamiaLisa Willis Natalie Leclaire M.Ed [employer] Pager [phone]

## 2013-05-02 NOTE — Progress Notes (Signed)
Nutrition Brief Note  Chart reviewed. Pt now transitioning to comfort care.  No further nutrition interventions warranted at this time.  Please re-consult as needed.   Kanton Kamel MS, RD, LDN Pager: [phone] After-hours pager: [phone]    

## 2013-05-02 NOTE — Discharge Summary (Signed)
Physician Discharge Summary  Jessica Oneal AVW:[id number] DOB: August 16, 1915 DOA: 04/28/2013  PCP: Pcp Not In System  Admit date: 04/28/2013 Discharge date: 05-31-2013  Time spent: 30 minutes  Recommendations for Outpatient Follow-up:  1. Patient discharged to Hospice  Discharge Diagnoses:  Principal Problem:   Influenza A Active Problems:   Sepsis   Acute respiratory failure   Discharge Condition: Stable  Diet recommendation: NPO  Filed Weights   04/28/13 1645 04/28/13 1930  Weight: 36.288 kg (80 lb) 36.1 kg (79 lb 9.4 oz)    History of present illness:  80 year old female with history of dementia, previously a patient of Dr. Westley Chandler hussain , who presents with altered mental status, and is currently unresponsive at the time of this history taking. According to the family that takes care of her, her son her daughter. The patient has been sick for the last 2 days. She has been more sleepy, lethargic, decreased by mouth intake , apparently no fever at home, however she was found to be febrile in the ER. The patient apparently had a loose bowel movement in the ED  Patient was fairly hypotensive upon presentation systolic blood pressure in the 70s. ABG showed a pH of 7.135 CO2 of 64. Her lactic acid was elevated at 5.67. Urinalysis was negative  Chest x-ray was negative  Family present at the bedside and a very realistic about expectations from this hospitalization. Understand the poor prognosis of the patient, and they do not want any heroic measures.   Hospital Course:  Patient is a pleasant 80 year old female with a past medical history of cognitive impairment, who was admitted to the medicine service on 04/28/2013. She presented to the emergency department with complaints of having an overall functional decline, failure to thrive, he coming increasingly sleepy, less interactive, lethargic, having minimal by mouth intake. She was found to be septic on presentation evidence by hypotension with  systolic blood pressures in the 70s, elevated lactate at 5.67. She tested positive for the flu and was started on Tamiflu. In the following days she did not show improvement and actually continued to decline. Goals of care were discussed with her daughter and granddaughters at bedside. Given that underlying disease process likely advance despite current interventions at continuing to decline, family members expressed desire to focus care on comfort and minimizing burdensome interventions, with a focus on quality of life. Palliative care was consulted. On 05/31/2013, patient having a significant decline, becoming unresponsive having agonal breaths. She will be admitted toGIP, with Dr. Phillips Odor of palliative care taking over her care. I anticipate she will pass in the next 24-48 hours.  Consultations:  Paliative  Discharge Exam: Filed Vitals:   05/31/2013 0505  BP:   Pulse: 80  Temp:   Resp: 24   General: Patient demonstrated agonal breaths, unresponsive  Cardiovascular: Tachycardic, regular rate rhythm normal S1-S2  Respiratory: Extensive rales, crackles, coarse respiratory sounds bilaterally. Tachypneic  Abdomen: Soft nontender nondistended  Musculoskeletal: Muscle atrophy noted, no edema  Discharge Instructions     Medication List    ASK your doctor about these medications       cycloSPORINE 0.05 % ophthalmic emulsion  Commonly known as:  RESTASIS  Place 1 drop into both eyes 2 (two) times daily.       No Known Allergies    The results of significant diagnostics from this hospitalization (including imaging, microbiology, ancillary and laboratory) are listed below for reference.    Significant Diagnostic Studies: Dg Chest 1 View  04/28/2013  CLINICAL DATA:  Shortness of breath.  EXAM: CHEST - 1 VIEW  COMPARISON:  02/19/2009.  FINDINGS: The cardiac silhouette, mediastinal and hilar contours are stable. There is tortuosity and calcification of the thoracic aorta. Chronic  emphysematous changes with probable superimposed bronchitis. No focal airspace consolidation or pleural effusion. The bony thorax is intact.  IMPRESSION: Emphysema with low lung volumes.  Suspect overlying bronchitis.   Electronically Signed   By: Loralie Champagne M.D.   On: 04/28/2013 16:19   Portable Chest 1 View  04/29/2013   CLINICAL DATA:  Pneumonia .  EXAM: PORTABLE CHEST - 1 VIEW  COMPARISON:  04/28/2013.  02/19/2009.  FINDINGS: Mediastinum and hilar structures are normal. Changes suggesting COPD noted. Mild bilateral interstitial prominence noted. These findings suggest pneumonitis. Heart size and pulmonary vascularity normal. No pleural effusion pneumothorax. No acute osseous abnormality.  IMPRESSION: 1. Persistent interstitial prominence suggesting bilateral pneumonitis. 2. COPD.   Electronically Signed   By: Maisie Fus  Register   On: 04/29/2013 07:11   Dg Abd Portable 1v  04/28/2013   CLINICAL DATA:  Diarrhea.  EXAM: PORTABLE ABDOMEN - 1 VIEW  COMPARISON:  Abdominal radiograph 09/06/2008.  FINDINGS: Gas and stool are noted throughout the colon extending to the level of the distal rectum. Multiple nondilated gas-filled loops of small bowel project over the central abdomen. No definite pathologic dilatation of small bowel. No gross evidence of pneumoperitoneum on this single supine view. A rectal thermistor noted.  IMPRESSION: 1. Nonspecific, nonobstructive bowel gas pattern. 2. No pneumoperitoneum.   Electronically Signed   By: Trudie Reed M.D.   On: 04/28/2013 19:09    Microbiology: Recent Results (from the past 240 hour(s))  URINE CULTURE     Status: None   Collection Time    04/28/13  4:34 PM      Result Value Range Status   Specimen Description URINE, CATHETERIZED   Final   Special Requests NONE   Final   Culture  Setup Time     Final   Value: 04/28/2013 18:45     Performed at [facility] Count     Final   Value: NO GROWTH     Performed at [facility]    Culture     Final   Value: NO GROWTH     Performed at [facility]   Report Status 04/29/2013 FINAL   Final  CULTURE, BLOOD (ROUTINE X 2)     Status: None   Collection Time    04/28/13  5:13 PM      Result Value Range Status   Specimen Description BLOOD RIGHT ANTECUBITAL   Final   Special Requests     Final   Value: BOTTLES DRAWN AEROBIC AND ANAEROBIC 10CC BLUE, 5CC RED   Culture  Setup Time     Final   Value: 04/29/2013 00:28     Performed at [facility]   Culture     Final   Value:        BLOOD CULTURE RECEIVED NO GROWTH TO DATE CULTURE WILL BE HELD FOR 5 DAYS BEFORE ISSUING A FINAL NEGATIVE REPORT     Performed at [facility]   Report Status PENDING   Incomplete  CULTURE, BLOOD (ROUTINE X 2)     Status: None   Collection Time    04/28/13  5:13 PM      Result Value Range Status   Specimen Description BLOOD RIGHT HAND   Final  Special Requests BOTTLES DRAWN AEROBIC AND ANAEROBIC 10CC   Final   Culture  Setup Time     Final   Value: 04/29/2013 00:29     Performed at [facility]   Culture     Final   Value:        BLOOD CULTURE RECEIVED NO GROWTH TO DATE CULTURE WILL BE HELD FOR 5 DAYS BEFORE ISSUING A FINAL NEGATIVE REPORT     Performed at [facility]   Report Status PENDING   Incomplete  CULTURE, BLOOD (ROUTINE X 2)     Status: None   Collection Time    04/28/13  7:25 PM      Result Value Range Status   Specimen Description BLOOD HAND RIGHT   Final   Special Requests BOTTLES DRAWN AEROBIC ONLY 5CC   Final   Culture  Setup Time     Final   Value: 04/29/2013 01:32     Performed at [facility]   Culture     Final   Value:        BLOOD CULTURE RECEIVED NO GROWTH TO DATE CULTURE WILL BE HELD FOR 5 DAYS BEFORE ISSUING A FINAL NEGATIVE REPORT     Performed at [facility]   Report Status PENDING   Incomplete  CULTURE, BLOOD (ROUTINE X 2)     Status: None   Collection Time    04/28/13  7:25 PM      Result Value  Range Status   Specimen Description BLOOD HAND LEFT   Final   Special Requests BOTTLES DRAWN AEROBIC ONLY 5CC   Final   Culture  Setup Time     Final   Value: 04/29/2013 01:31     Performed at [facility]   Culture     Final   Value:        BLOOD CULTURE RECEIVED NO GROWTH TO DATE CULTURE WILL BE HELD FOR 5 DAYS BEFORE ISSUING A FINAL NEGATIVE REPORT     Performed at [facility]   Report Status PENDING   Incomplete  MRSA PCR SCREENING     Status: None   Collection Time    04/28/13  7:39 PM      Result Value Range Status   MRSA by PCR NEGATIVE  NEGATIVE Final   Comment:            The GeneXpert MRSA Assay (FDA     approved for NASAL specimens     only), is one component of a     comprehensive MRSA colonization     surveillance program. It is not     intended to diagnose MRSA     infection nor to guide or     monitor treatment for     MRSA infections.  URINE CULTURE     Status: None   Collection Time    04/28/13  9:36 PM      Result Value Range Status   Specimen Description URINE, CATHETERIZED   Final   Special Requests CX ADDED AT 2208 ON 161096   Final   Culture  Setup Time     Final   Value: 04/29/2013 03:08     Performed at [facility]   Colony Count     Final   Value: NO GROWTH     Performed at [facility]   Culture     Final   Value: NO GROWTH     Performed at  [facility]   Report Status 04/29/2013 FINAL   Final     Labs: Basic Metabolic Panel:  Recent Labs Lab 04/28/13 1726 04/28/13 1900 04/29/13 0600  NA 138  --  141  K 4.1  --  4.0  CL 101  --  103  CO2  --   --  24  GLUCOSE 185*  --  110*  BUN 42*  --  40*  CREATININE 1.70* 1.53* 1.34*  CALCIUM  --   --  7.8*  MG  --  2.0  --    Liver Function Tests:  Recent Labs Lab 04/28/13 1900 04/29/13 0600  AST 31 32  ALT 13 14  ALKPHOS 86 91  BILITOT 0.3 0.4  PROT 6.4 6.3  ALBUMIN 2.9* 2.7*   No results found for this basename: LIPASE, AMYLASE,  in the  last 168 hours No results found for this basename: AMMONIA,  in the last 168 hours CBC:  Recent Labs Lab 04/28/13 1712 04/28/13 1726 04/28/13 1900 04/29/13 0600  WBC 11.8*  --  7.2 11.0*  NEUTROABS 9.0*  --   --   --   HGB 11.6* 11.9* 10.7* 10.5*  HCT 35.1* 35.0* 31.8* 31.2*  MCV 92.9  --  92.2 90.7  PLT 199  --  136* 162   Cardiac Enzymes: No results found for this basename: CKTOTAL, CKMB, CKMBINDEX, TROPONINI,  in the last 168 hours BNP: BNP (last 3 results)  Recent Labs  04/28/13 1712  PROBNP 9395.0*   CBG: No results found for this basename: GLUCAP,  in the last 168 hours     Signed:  Jeralyn BennettZAMORA, Ly Wass  [facility] 05/16/2013, 5:22 PM

## 2013-05-02 NOTE — Progress Notes (Signed)
Room 5W21 - Jessica Oneal - Pt admitted under GIP status [facility] Cape[facility]ah [facility].  [facility]-R.Lulla Linville RN  GIP [facility] diagnosis of 331 - Alzheimer's dementia. Pt is DNR code. Pt non responsive with labored breathing - discussed with staff RN. Pt's life expectancy is 24hrs to 4-5 days.  Dr. Julaine FusiBeth Golding is attending. Please call [facility] @ [phone]- ask for RN Liaison or after hours,ask for on-call RN with any hospice needs.  Thank you. [name], RN [facility] Hospice Liaison [phone])

## 2013-05-02 NOTE — Progress Notes (Signed)
Room 5W21 - Flo Shanksthel Mengel - Pt admitted under GIP status [facility] Cape[facility]ah Taylor.  [facility] Visit-R.Klani Caridi RN  GIP  [facility] diagnosis of 331 - Alzheimer's dementia.   Pt is DNR code.    Pt non responsive with labored breathing - discussed with staff RN. Pt's life expectancy is 24hrs to 4-5 days.    Please call [facility] @ [phone]- ask for RN Liaison or after hours,ask for on-call RN with any hospice needs.   Thank you.  Joneen Boersose B Durwin Davisson, RN  [facility]  Hospice Liaison  [phone])

## 2013-05-05 LAB — CULTURE, BLOOD (ROUTINE X 2)
CULTURE: NO GROWTH
CULTURE: NO GROWTH
Culture: NO GROWTH
Culture: NO GROWTH

## 2013-05-29 NOTE — Progress Notes (Signed)
Nutrition Brief Note  Chart reviewed. Pt now transitioning to comfort care.  No further nutrition interventions warranted at this time.  Please re-consult as needed.   Auria Mckinlay MS, RD, LDN Pager: [phone] After-hours pager: [phone]    

## 2013-05-29 NOTE — Progress Notes (Signed)
funeral home here to pick up pt.

## 2013-05-29 NOTE — Discharge Summary (Addendum)
Death Summary  Jessica Oneal MWN:[id number] DOB: [birth year] DOA: 05/20/2013  PCP: No PCP  PCP/Office notified: No  Admit date: 05/11/2013 Date of Death: 05/14/2013  Final Diagnoses:  Patient Active Problem List   Diagnosis Date Noted  . Dementia 05/21/2013  . Influenza A 04/29/2013  . Acute respiratory failure 04/29/2013  . Sepsis 04/28/2013   Hospital Course: 80 yo with dementia and failure to thrive admitted on 1/1 with respiratory failure secondary to influenza virus. She was started on IV antibiotics and antiviral medication but continued to decline with encephalopathy, dysphagia and worsening dyspnea. A decision was made to shift to full comfort care given her advanced age, debility and rapid decline. [facility] She was started on low dose morphine infusion for dyspnea and comfort. All non-essential medications were discontinued.  She expired at 6:03AM 05/13/2013, her son was at bedside and she was peaceful. I was available in house and pronounced patient and provided support to her son. Death Summary and Certificate completed.  Time: 45 minutes  Signed:  [facility] Center[facility]OLDING,ELIZABETH  Palliative Medicine Team 04/28/2013, 6:41 AM

## 2013-05-29 NOTE — Progress Notes (Signed)
Rn was called into room by son who stated he didn't think pt was breathing. RN assessed pt and confirmed that pt was without breath and pulse. Second RN, Pollyann GlenValorie Cloney to confirm 1st RN's finding. Son was informed at bedside. Bed placement was called. Pt was cleaned and made presentable for family. IV and tubes were removed

## 2013-05-29 NOTE — Progress Notes (Signed)
Continuous Morphine 1mg /mL bag stopped. Wasted 91mL in bag with Nelda MarseilleJenny Thacker, RN as witness. Tubing placed in black bin.

## 2013-05-29 DEATH — deceased

## 2013-06-02 NOTE — H&P (Signed)
Patient expired prior to completion of [facility] H&P. Please refer to original admission H&P and death summary for details of this hospitalization.  Anderson MaltaElizabeth Shavawn Stobaugh, DO Palliative Medicine

## 2014-04-11 IMAGING — CR DG CHEST 1V
1 series · 1 of 1 positions shown · non-contrast
Comparison: 02/19/2009.

CLINICAL DATA: Shortness of breath.

EXAM:
CHEST - 1 VIEW

[AP]
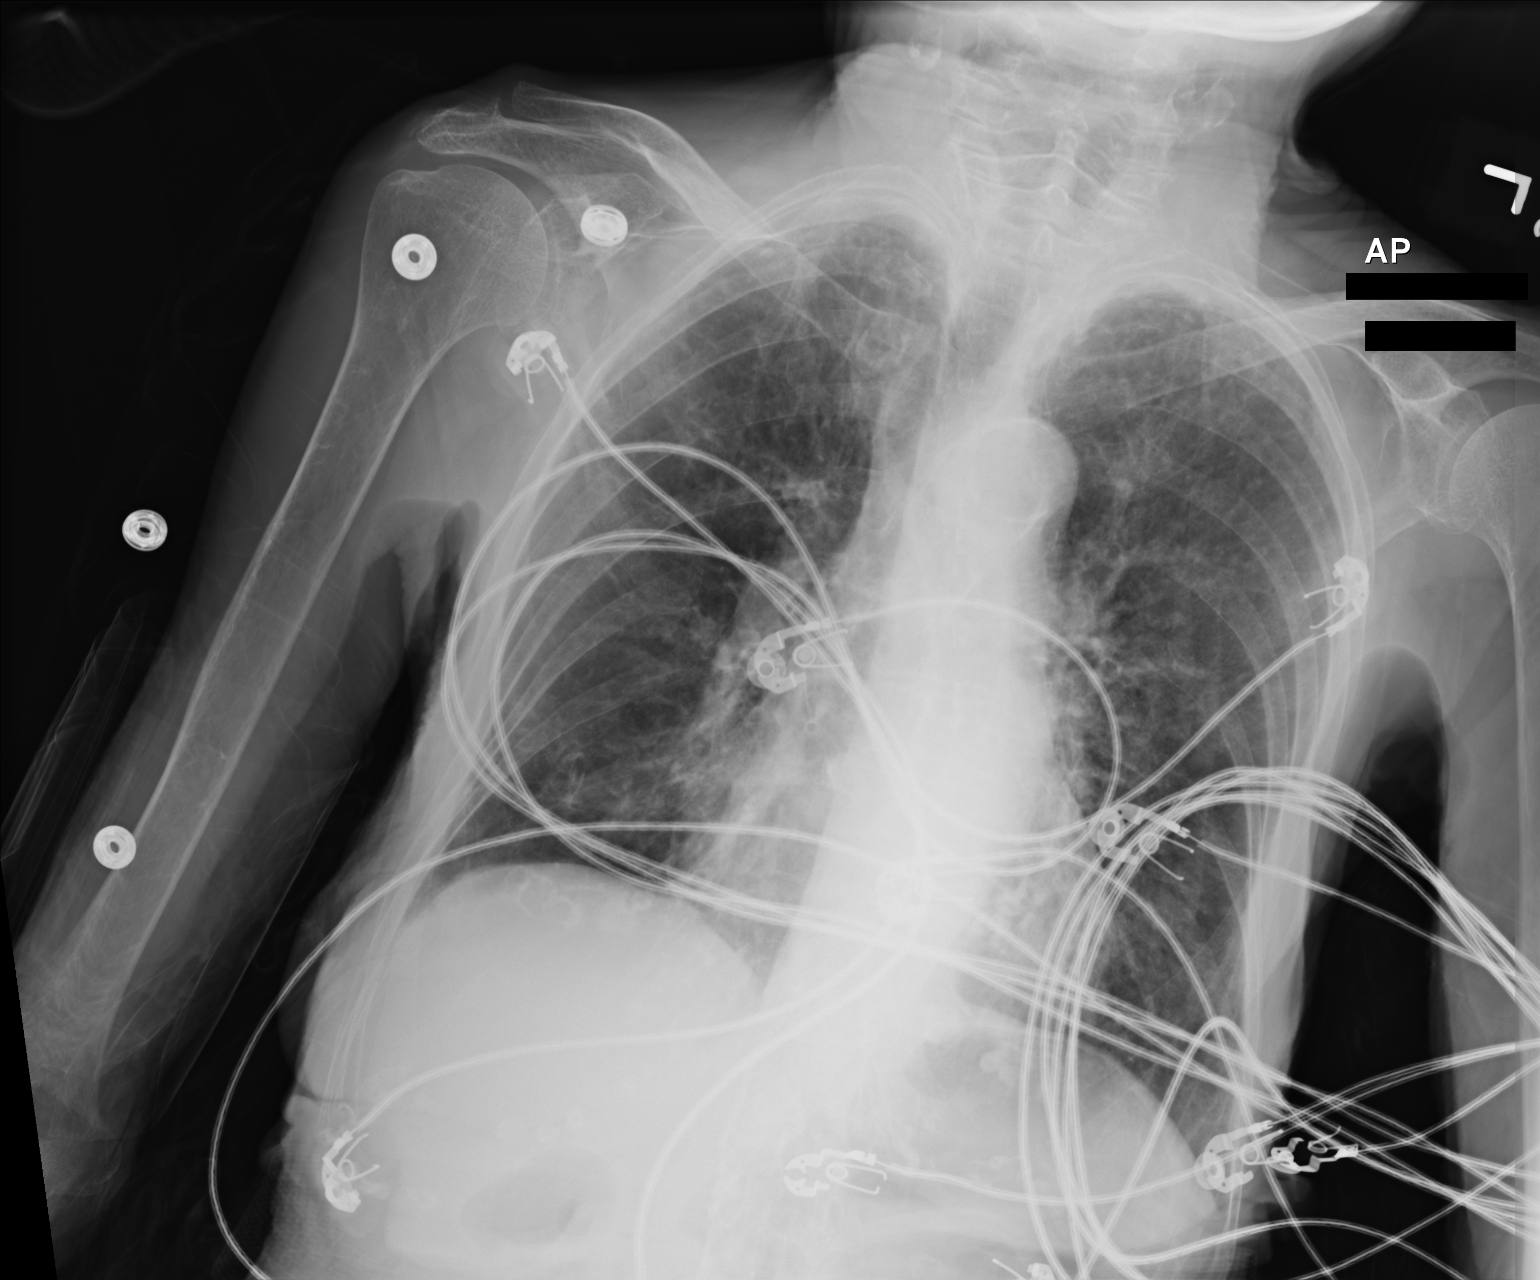

[1 of 1 positions shown; findings below may reference images not displayed]

FINDINGS: The cardiac silhouette, mediastinal and hilar contours are stable.
There is tortuosity and calcification of the thoracic aorta. Chronic
emphysematous changes with probable superimposed bronchitis. No
focal airspace consolidation or pleural effusion. The bony thorax is
intact.
IMPRESSION: Emphysema with low lung volumes.  Suspect overlying bronchitis.

## 2014-04-12 IMAGING — CR DG CHEST 1V PORT
1 series · 1 of 1 positions shown · non-contrast
Comparison: 04/28/2013.  02/19/2009.

CLINICAL DATA: Pneumonia .

EXAM:
PORTABLE CHEST - 1 VIEW

[AP]
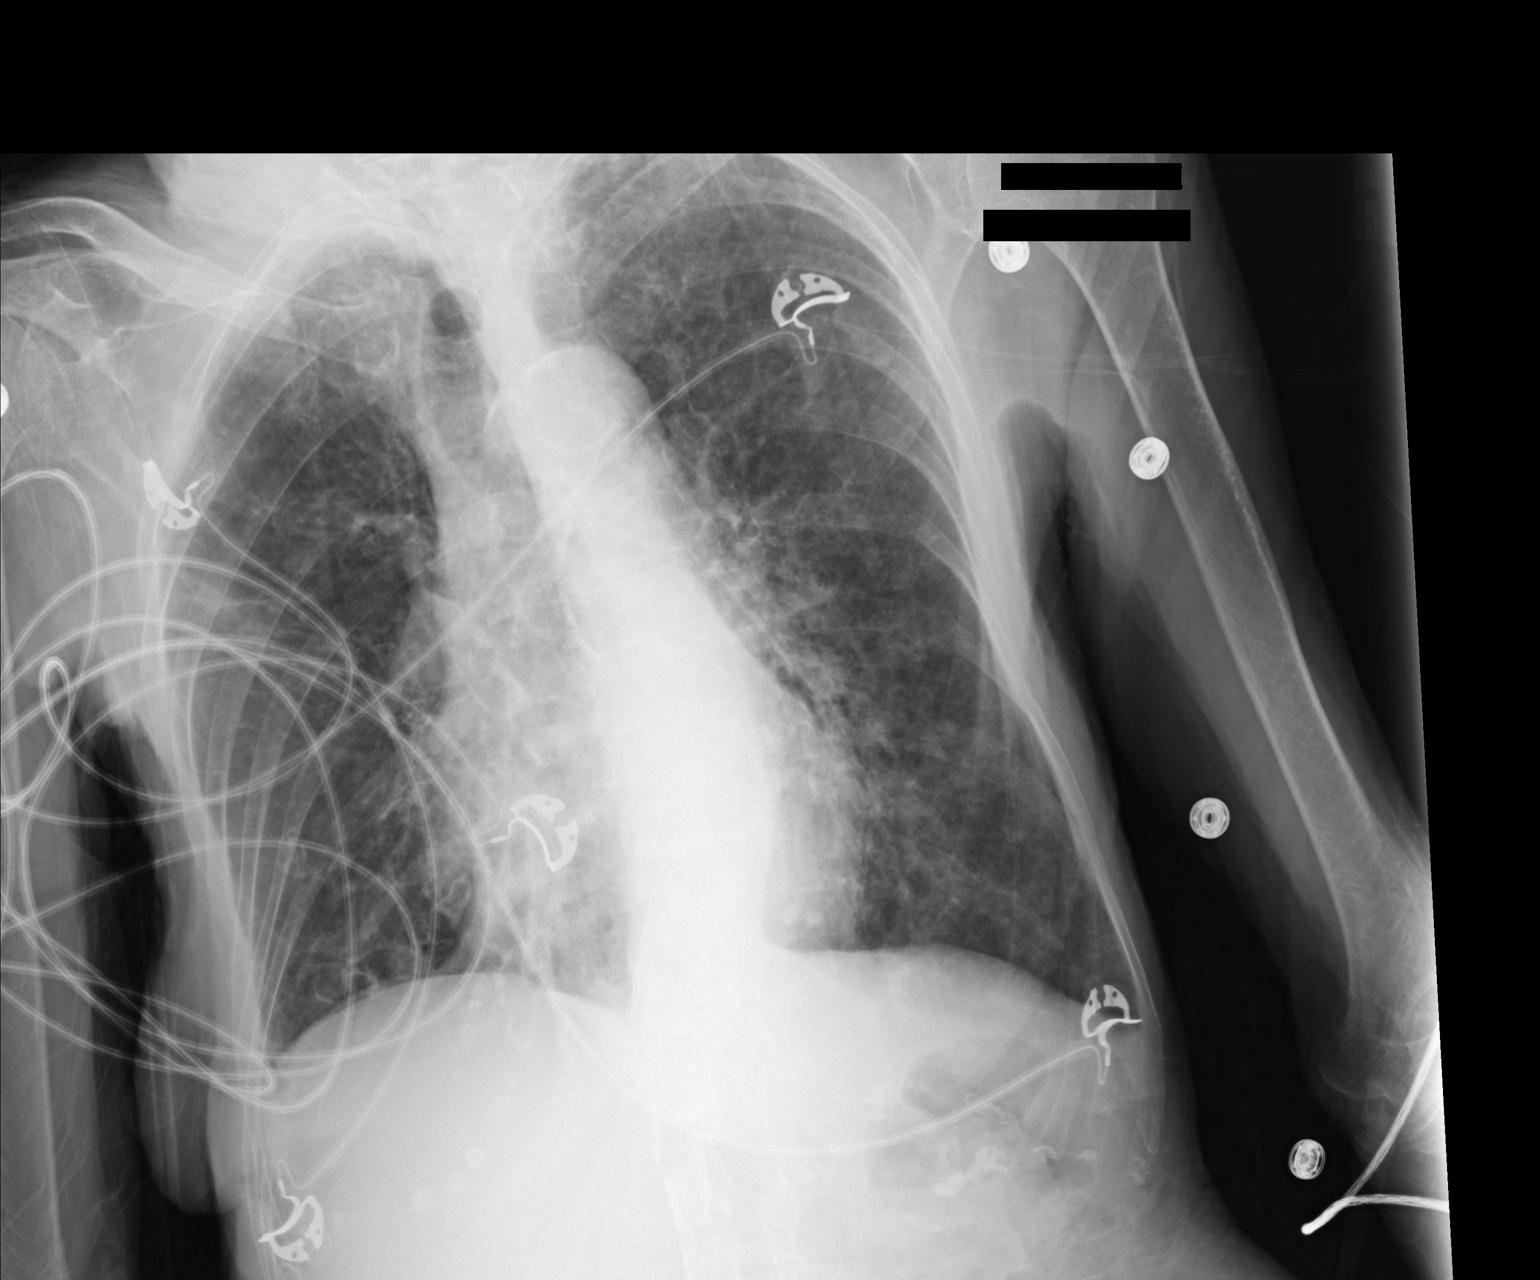

[1 of 1 positions shown; findings below may reference images not displayed]

FINDINGS: Mediastinum and hilar structures are normal. Changes suggesting COPD
noted. Mild bilateral interstitial prominence noted. These findings
suggest pneumonitis. Heart size and pulmonary vascularity normal. No
pleural effusion pneumothorax. No acute osseous abnormality.
IMPRESSION: 1. Persistent interstitial prominence suggesting bilateral
pneumonitis.
2. COPD.
